# Patient Record
Sex: Female | Born: 1990
Health system: Southern US, Community
[De-identification: ages and names within clinical notes are randomized; demographics above are authoritative.]

## PROBLEM LIST (undated history)

## (undated) DIAGNOSIS — F410 Panic disorder [episodic paroxysmal anxiety] without agoraphobia: Secondary | ICD-10-CM

## (undated) DIAGNOSIS — M419 Scoliosis, unspecified: Secondary | ICD-10-CM

## (undated) DIAGNOSIS — F909 Attention-deficit hyperactivity disorder, unspecified type: Secondary | ICD-10-CM

---
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
## 2010-06-03 ENCOUNTER — Inpatient Hospital Stay (HOSPITAL_COMMUNITY): Admission: AD | Admit: 2010-06-03 | Discharge: 2010-06-03 | Payer: Self-pay | Admitting: Obstetrics and Gynecology

## 2010-06-03 ENCOUNTER — Ambulatory Visit: Payer: Self-pay | Admitting: Nurse Practitioner

## 2011-03-04 LAB — WET PREP, GENITAL
Trich, Wet Prep: NONE SEEN
Yeast Wet Prep HPF POC: NONE SEEN

## 2011-03-04 LAB — URINALYSIS, ROUTINE W REFLEX MICROSCOPIC
Bilirubin Urine: NEGATIVE
Ketones, ur: NEGATIVE mg/dL
Specific Gravity, Urine: 1.02 (ref 1.005–1.030)
Urobilinogen, UA: 0.2 mg/dL (ref 0.0–1.0)

## 2011-03-04 LAB — CBC
HCT: 42.9 % (ref 36.0–46.0)
Hemoglobin: 14.7 g/dL (ref 12.0–15.0)
MCHC: 34.2 g/dL (ref 30.0–36.0)
MCV: 90.7 fL (ref 78.0–100.0)
Platelets: 237 10*3/uL (ref 150–400)
RDW: 12.6 % (ref 11.5–15.5)

## 2011-03-04 LAB — GC/CHLAMYDIA PROBE AMP, GENITAL
Chlamydia, DNA Probe: NEGATIVE
GC Probe Amp, Genital: NEGATIVE

## 2011-03-04 LAB — POCT PREGNANCY, URINE: Preg Test, Ur: NEGATIVE

## 2011-03-18 ENCOUNTER — Inpatient Hospital Stay (HOSPITAL_COMMUNITY): Payer: Self-pay

## 2011-03-18 ENCOUNTER — Inpatient Hospital Stay (HOSPITAL_COMMUNITY)
Admission: AD | Admit: 2011-03-18 | Discharge: 2011-03-19 | Disposition: A | Discharge status: Home / Self Care | Payer: Self-pay | Source: Ambulatory Visit | Attending: Obstetrics & Gynecology | Admitting: Obstetrics & Gynecology

## 2011-03-18 DIAGNOSIS — R1031 Right lower quadrant pain: Secondary | ICD-10-CM

## 2011-03-18 LAB — CBC
Hemoglobin: 14.6 g/dL (ref 12.0–15.0)
MCH: 30.2 pg (ref 26.0–34.0)
MCHC: 34.3 g/dL (ref 30.0–36.0)
RBC: 4.83 MIL/uL (ref 3.87–5.11)
WBC: 10.7 10*3/uL — ABNORMAL HIGH (ref 4.0–10.5)

## 2011-03-18 LAB — URINALYSIS, ROUTINE W REFLEX MICROSCOPIC
Ketones, ur: >80 mg/dL — AB
Nitrite: NEGATIVE
Urobilinogen, UA: 0.2 mg/dL (ref 0.0–1.0)

## 2011-03-18 LAB — URINE MICROSCOPIC-ADD ON

## 2011-03-18 LAB — DIFFERENTIAL
Basophils Relative: 0 % (ref 0–1)
Eosinophils Relative: 0 % (ref 0–5)
Lymphocytes Relative: 19 % (ref 12–46)
Lymphs Abs: 2.1 10*3/uL (ref 0.7–4.0)
Monocytes Absolute: 0.7 10*3/uL (ref 0.1–1.0)
Monocytes Relative: 7 % (ref 3–12)

## 2011-03-18 LAB — COMPREHENSIVE METABOLIC PANEL
BUN: 15 mg/dL (ref 6–23)
Chloride: 104 mEq/L (ref 96–112)
Creatinine, Ser: 0.92 mg/dL (ref 0.4–1.2)
GFR calc Af Amer: >60 mL/min (ref >60)
Potassium: 4.4 mEq/L (ref 3.5–5.1)
Total Bilirubin: 1.3 mg/dL — ABNORMAL HIGH (ref 0.3–1.2)

## 2011-03-18 LAB — WET PREP, GENITAL: Trich, Wet Prep: NONE SEEN

## 2011-03-18 MED ORDER — IOHEXOL 300 MG/ML  SOLN
100.0000 mL | Freq: Once | INTRAMUSCULAR | Status: AC | PRN
  Administered 2011-03-18: 100 mL via INTRAVENOUS

## 2011-03-19 LAB — GC/CHLAMYDIA PROBE AMP, GENITAL: GC Probe Amp, Genital: NEGATIVE

## 2011-03-29 LAB — RH IMMUNE GLOB WKUP(>/=20WKS)(NOT WOMEN'S HOSP): Fetal Screen: NEGATIVE

## 2011-03-29 LAB — CBC
MCV: 84.9 fL (ref 78.0–98.0)
RBC: 4.12 MIL/uL (ref 3.80–5.70)
RDW: 13.4 % (ref 11.4–15.5)

## 2011-05-01 NOTE — Discharge Summary (Signed)
Cheryl Green, DOLS NO.:  [id number]   MEDICAL RECORD NO.:  [id number]          PATIENT TYPE:  INP   LOCATION:  9317                          FACILITY:  [facility]   PHYSICIAN:  Huel Cote, M.D. DATE OF BIRTH:  Nov 27, 1991   DATE OF ADMISSION:  01/04/2008  DATE OF DISCHARGE:  01/06/2008                               DISCHARGE SUMMARY   DISCHARGE DIAGNOSES:  1. Pre-term pregnancy at 35 weeks, delivered.  2. Status post precipitous vaginal delivery.  3. Initial plan was adoption for the baby, however, the patient has      decided to keep the baby and have him live primarily with the      father of the baby's parents.   DISCHARGE MEDICATIONS:  1. Motrin 600 mg p.o. every 6 hours.   DISCHARGE FOLLOWUP:  The patient is to follow up in 6 weeks for her  routine postpartum exam.   HOSPITAL COURSE:  The patient is a 20 year old G1, P0 who was admitted  at [redacted] weeks gestation, with an estimated due date of February 08, 2008  by late care and a 25-week ultrasound only, unknown LMP.  She presented  to labor and delivery of labor and delivery with complaints of  spontaneous rupture of membranes at 6 o'clock a.m. and on admission was  3 cm, 80% effaced, and zero station, at approximately 8:30 a.m. with  irregular contractions.  She was admitted, and I was called to her room  at 10:00 a.m. for delivery, after she arrived there was found to be  completely dilated and involuntarily pushing.  Prenatal care with very  scanty with two visits only to our office.  The patient had plans to  give the baby for adoption and a contacted the [employer] [facility] [employer]  but has since changed her mind regarding that.  She did have a positive  chlamydia culture in pregnancy for which she took antibiotics, however  never had her partner treated and had been intimate with him again, so  was re-treated her hospital stay.  Group B strep status was unknown, due  to her pre-term status.  Prenatal  labs are as follows O+,  __________  negative, RPR nonreactive, rubella immune, hepatitis B surface antigen  negative, HIV negative, GC negative, chlamydia positive with no test of  cure performed, group B strep unknown.  One-hour Glucola is 100.   PAST OBSTETRICAL HISTORY:  None.   PAST GYNECOLOGICAL HISTORY:  No abnormal Pap smears.   PAST SURGICAL HISTORY:  None.   PAST MEDICAL HISTORY:  None.   ALLERGIES:  NONE.   MEDICATIONS:  None.   She was afebrile with stable vital signs.  Fetal heart rate was  reactive.  Shortly after arrival labor delivery, she was found to be  completely dilated, completely effaced and +3 station with pushing.  She  pushed approximately three times with a normal spontaneous vaginal  delivery of a viable female infant over an intact perineum.  Apgars were 7  and 9, weight was 5 pounds, 4 ounces. There is a nuchal cord x1 which  was reduced over  the head.  NICU was in attendance, given the pre-term  status with poor dating and the placenta delivered spontaneously.  Cultures were done, and the placenta was sent to pathology.  Baby had a  good cry and was vigorous but did develop some grunting, so was taken to  the NICU for observation.  The patient was not able to receive group B  strep prophylaxis, secondary to her quick delivery.  NICU was notified  regarding the positive chlamydia and pregnancy.  Cervix and rectum were  intact.  Estimated blood loss was 350 mL.  On postpartum day #1, the  patient was doing well.  She had been to the __________  NICU and was  visiting, meeting with the social workers to determine any plan as far  as the adoption goes.  Her hemoglobin was 11.3.  She received another  gram of azithromycin p.o., given her lack of a test of cure and being  with her partner again who has not been tested.  He was also given a  prescription to fill upon discharge.  Postpartum day #2, the patient was  feeling quite well, except for some cramps.   Vaginal bleeding was  minimal.  After careful consideration, she and her partner have decided  to keep the baby.  The father of the baby's parents will be the primary  caregivers, and the baby stay with them, and the mother will visit.  Fundus is firm.  On discharge, she is afebrile, stable vital signs.  She  was given prescriptions for Motrin and was advised to follow up in the  office in 6 weeks.  She will be discharged to home.  The baby will be in  the NICU, probably for an additional week on antibiotics.      Huel Cote, M.D.  Electronically Signed     KR/MEDQ  D:  01/06/2008  T:  01/06/2008  Job:  [id number]

## 2011-05-01 NOTE — Discharge Summary (Signed)
Cheryl Green, PRIBBLE NO.:  [id number]   MEDICAL RECORD NO.:  [id number]          PATIENT TYPE:  INP   LOCATION:  9320                          [facility]ACILITY:  [facility]   PHYSICIAN:  Zenaida Niece, M.D.DATE OF BIRTH:  June 09, 1991   DATE OF ADMISSION:  02/19/2009  DATE OF DISCHARGE:  02/21/2009                               DISCHARGE SUMMARY   ADMISSION DIAGNOSIS:  Intrauterine pregnancy at 39 weeks.   DISCHARGE DIAGNOSIS:  Intrauterine pregnancy at 39 weeks.   PROCEDURES:  On February 19, 2009, she had a precipitous vaginal delivery.   COMPLICATIONS:  None.   HISTORY AND PHYSICAL:  This is a 20 year old gravida 2, para 0-1-0-1  with an EGA of [redacted] weeks who presents with complaint of rapid onset of  contractions without ruptured membranes or bleeding.  On evaluation in  triage, she was 8 cm dilated.   PRENATAL CARE:  She had an early ultrasound, but did not present for  prenatal care until approximately 30 weeks.  She was treated with  Zithromax for chlamydia twice and is putting the baby up for adoption.   PRENATAL LABORATORIES:  Blood type is O+ with a week D antibody.  RPR  nonreactive, hepatitis B surface antigen negative, rubella immune, HIV  negative, and gonorrhea negative, chlamydia positive, and group B strep  is negative.   PAST OBSTETRICAL HISTORY:  In January 2009, vaginal delivery at 35  weeks, 5 pounds 4 ounces complicated by preterm labor.   GYNECOLOGIC HISTORY:  History of chlamydia.   PAST MEDICAL HISTORY:  History of scoliosis.   PHYSICAL EXAMINATION:  She is afebrile with stable vital signs.  Fetal  heart tracing was okay prior to delivery.  On my first exam, she is  postpartum with firm fundus at U-1.  Placenta was delivered with fundal  pressure.  She had a first-degree laceration repaired with 3-0 Vicryl  with local block.   HOSPITAL COURSE:  The patient was admitted, rapidly progressed to  complete and had a precipitous delivery.  Cam Hai, nurse midwife  did the delivery as I was not yet at the hospital.  Delivery was  uncomplicated and she delivered a viable female with Apgars of 8 and 9,  weight 7 pounds and 3 ounces.  Postpartum, again I examined her and  delivered the placenta and repaired a small first-degree laceration.  Postpartum, she had no significant complications.  She is giving the  baby up for adoption.  Postpartum hemoglobin was 11.6.  On postpartum  #2, she was felt to be stable enough for discharge home.   DISCHARGE INSTRUCTIONS:  Regular diet, pelvic rest, and follow up in 6  weeks.   MEDICATIONS:  Percocet #20, one to two p.o. q.4-6 h. p.r.n. pain and  over-the-counter ibuprofen as needed and she was given our discharge  pamphlet.      Zenaida Niece, M.D.  Electronically Signed     TDM/MEDQ  D:  02/21/2009  T:  02/21/2009  Job:  [id number]

## 2011-07-06 ENCOUNTER — Emergency Department (HOSPITAL_COMMUNITY)
Admission: EM | Admit: 2011-07-06 | Discharge: 2011-07-06 | Disposition: A | Discharge status: Home / Self Care | Payer: Self-pay | Attending: Emergency Medicine | Admitting: Emergency Medicine

## 2011-07-06 DIAGNOSIS — R109 Unspecified abdominal pain: Secondary | ICD-10-CM | POA: Insufficient documentation

## 2011-07-06 LAB — DIFFERENTIAL
Eosinophils Absolute: 0.1 10*3/uL (ref 0.0–0.7)
Eosinophils Relative: 2 % (ref 0–5)
Lymphocytes Relative: 30 % (ref 12–46)
Lymphs Abs: 2 10*3/uL (ref 0.7–4.0)
Monocytes Absolute: 0.3 10*3/uL (ref 0.1–1.0)
Monocytes Relative: 4 % (ref 3–12)
Neutro Abs: 4.2 10*3/uL (ref 1.7–7.7)
Neutrophils Relative %: 64 % (ref 43–77)

## 2011-07-06 LAB — POCT I-STAT, CHEM 8
BUN: 13 mg/dL (ref 6–23)
Chloride: 106 mEq/L (ref 96–112)
Creatinine, Ser: 0.8 mg/dL (ref 0.50–1.10)
Hemoglobin: 13.6 g/dL (ref 12.0–15.0)
Sodium: 141 mEq/L (ref 135–145)
TCO2: 24 mmol/L (ref 0–100)

## 2011-07-06 LAB — URINALYSIS, ROUTINE W REFLEX MICROSCOPIC
Glucose, UA: NEGATIVE mg/dL
Leukocytes, UA: NEGATIVE
pH: 6.5 (ref 5.0–8.0)

## 2011-07-06 LAB — CBC
HCT: 37.6 % (ref 36.0–46.0)
MCV: 86.8 fL (ref 78.0–100.0)
RDW: 12.3 % (ref 11.5–15.5)

## 2011-07-06 LAB — WET PREP, GENITAL: Yeast Wet Prep HPF POC: NONE SEEN

## 2011-07-06 LAB — URINE MICROSCOPIC-ADD ON

## 2011-07-07 LAB — GC/CHLAMYDIA PROBE AMP, GENITAL: GC Probe Amp, Genital: NEGATIVE

## 2011-09-07 LAB — ANAEROBIC CULTURE

## 2011-09-07 LAB — CBC
MCHC: 33.8
MCV: 83.7
MCV: 84.3
Platelets: 235
Platelets: 303
RDW: 13.2
WBC: 13

## 2011-09-07 LAB — RPR: RPR Ser Ql: NONREACTIVE

## 2011-09-14 LAB — RH IMMUNE GLOBULIN WORKUP (NOT WOMEN'S HOSP)
ABO/RH(D): O NEG
Antibody Screen: NEGATIVE

## 2011-09-14 LAB — WET PREP, GENITAL: Trich, Wet Prep: NONE SEEN

## 2011-09-14 LAB — HCG, QUANTITATIVE, PREGNANCY: hCG, Beta Chain, Quant, S: 9792 — ABNORMAL HIGH

## 2011-09-14 LAB — CBC
Hemoglobin: 13.1
MCHC: 33.8
MCV: 88.4
RBC: 4.38
RDW: 13.5
WBC: 8.7

## 2011-09-14 LAB — ABO/RH: ABO/RH(D): O NEG

## 2011-10-26 ENCOUNTER — Emergency Department (HOSPITAL_COMMUNITY): Payer: Medicaid Other

## 2011-10-26 ENCOUNTER — Emergency Department (HOSPITAL_COMMUNITY)
Admission: EM | Admit: 2011-10-26 | Discharge: 2011-10-27 | Discharge status: Against Medical Advice | Payer: Medicaid Other | Attending: Emergency Medicine | Admitting: Emergency Medicine

## 2011-10-26 ENCOUNTER — Encounter: Payer: Self-pay | Admitting: *Deleted

## 2011-10-26 DIAGNOSIS — R5381 Other malaise: Secondary | ICD-10-CM | POA: Insufficient documentation

## 2011-10-26 DIAGNOSIS — H538 Other visual disturbances: Secondary | ICD-10-CM | POA: Insufficient documentation

## 2011-10-26 DIAGNOSIS — R51 Headache: Secondary | ICD-10-CM | POA: Insufficient documentation

## 2011-10-26 NOTE — ED Notes (Signed)
Patient walked out per the [employer] and did not talk to staff.  Will place back in waiting and attempt to call again

## 2011-10-26 NOTE — ED Notes (Signed)
Unable to locate patient. Called in waiting room, lobby, and triage. No answer

## 2011-10-26 NOTE — ED Notes (Signed)
Patient woke at 800 today with headache,  Blurred vision in left eye, feeling like she was going to faint.  Patient denies any weakness in her arms/legs.  She states she feels "the most light headed that she has ever felt in her life"

## 2012-01-06 ENCOUNTER — Encounter (HOSPITAL_COMMUNITY): Payer: Self-pay | Admitting: *Deleted

## 2012-01-06 ENCOUNTER — Emergency Department (HOSPITAL_COMMUNITY)
Admission: EM | Admit: 2012-01-06 | Discharge: 2012-01-06 | Disposition: A | Discharge status: Home / Self Care | Payer: Medicaid Other | Attending: Emergency Medicine | Admitting: Emergency Medicine

## 2012-01-06 ENCOUNTER — Other Ambulatory Visit: Payer: Self-pay

## 2012-01-06 DIAGNOSIS — F909 Attention-deficit hyperactivity disorder, unspecified type: Secondary | ICD-10-CM | POA: Insufficient documentation

## 2012-01-06 DIAGNOSIS — R0789 Other chest pain: Secondary | ICD-10-CM | POA: Insufficient documentation

## 2012-01-06 DIAGNOSIS — Z79899 Other long term (current) drug therapy: Secondary | ICD-10-CM | POA: Insufficient documentation

## 2012-01-06 DIAGNOSIS — R000 Tachycardia, unspecified: Secondary | ICD-10-CM | POA: Insufficient documentation

## 2012-01-06 DIAGNOSIS — R0989 Other specified symptoms and signs involving the circulatory and respiratory systems: Secondary | ICD-10-CM | POA: Insufficient documentation

## 2012-01-06 DIAGNOSIS — F410 Panic disorder [episodic paroxysmal anxiety] without agoraphobia: Secondary | ICD-10-CM | POA: Insufficient documentation

## 2012-01-06 DIAGNOSIS — F411 Generalized anxiety disorder: Secondary | ICD-10-CM | POA: Insufficient documentation

## 2012-01-06 DIAGNOSIS — R002 Palpitations: Secondary | ICD-10-CM | POA: Insufficient documentation

## 2012-01-06 DIAGNOSIS — R112 Nausea with vomiting, unspecified: Secondary | ICD-10-CM | POA: Insufficient documentation

## 2012-01-06 DIAGNOSIS — R109 Unspecified abdominal pain: Secondary | ICD-10-CM | POA: Insufficient documentation

## 2012-01-06 DIAGNOSIS — R0609 Other forms of dyspnea: Secondary | ICD-10-CM | POA: Insufficient documentation

## 2012-01-06 HISTORY — DX: Attention-deficit hyperactivity disorder, unspecified type: F90.9

## 2012-01-06 LAB — DIFFERENTIAL
Basophils Absolute: 0 10*3/uL (ref 0.0–0.1)
Basophils Relative: 0 % (ref 0–1)
Lymphocytes Relative: 9 % — ABNORMAL LOW (ref 12–46)
Monocytes Absolute: 0.9 10*3/uL (ref 0.1–1.0)
Neutro Abs: 11.4 10*3/uL — ABNORMAL HIGH (ref 1.7–7.7)
Neutrophils Relative %: 84 % — ABNORMAL HIGH (ref 43–77)

## 2012-01-06 LAB — RAPID URINE DRUG SCREEN, HOSP PERFORMED
Barbiturates: NOT DETECTED
Benzodiazepines: NOT DETECTED
Cocaine: NOT DETECTED
Tetrahydrocannabinol: POSITIVE — AB

## 2012-01-06 LAB — POCT PREGNANCY, URINE: Preg Test, Ur: NEGATIVE

## 2012-01-06 LAB — POCT I-STAT, CHEM 8
BUN: 10 mg/dL (ref 6–23)
Calcium, Ion: 1.15 mmol/L (ref 1.12–1.32)
Chloride: 106 mEq/L (ref 96–112)
HCT: 35 % — ABNORMAL LOW (ref 36.0–46.0)
Potassium: 3.6 mEq/L (ref 3.5–5.1)
Sodium: 144 mEq/L (ref 135–145)

## 2012-01-06 LAB — CBC
HCT: 34.6 % — ABNORMAL LOW (ref 36.0–46.0)
MCHC: 34.7 g/dL (ref 30.0–36.0)
Platelets: 170 10*3/uL (ref 150–400)
RDW: 12.2 % (ref 11.5–15.5)
WBC: 13.5 10*3/uL — ABNORMAL HIGH (ref 4.0–10.5)

## 2012-01-06 MED ORDER — LORAZEPAM 1 MG PO TABS: 1.0000 mg | ORAL_TABLET | Freq: Three times a day (TID) | ORAL | Status: AC | PRN

## 2012-01-06 MED ORDER — LORAZEPAM 2 MG/ML IJ SOLN
INTRAMUSCULAR | Status: AC
  Filled 2012-01-06: qty 1

## 2012-01-06 MED ORDER — ONDANSETRON 8 MG PO TBDP: 8.0000 mg | ORAL_TABLET | Freq: Three times a day (TID) | ORAL | Status: AC | PRN

## 2012-01-06 MED ORDER — LORAZEPAM 2 MG/ML IJ SOLN
1.0000 mg | Freq: Once | INTRAMUSCULAR | Status: AC
  Administered 2012-01-06: 1 mg via INTRAVENOUS

## 2012-01-06 MED ORDER — ONDANSETRON HCL 4 MG/2ML IJ SOLN
INTRAMUSCULAR | Status: AC
  Administered 2012-01-06: 03:00:00
  Filled 2012-01-06: qty 2

## 2012-01-06 MED ORDER — PROMETHAZINE HCL 25 MG/ML IJ SOLN
12.5000 mg | INTRAMUSCULAR | Status: AC
  Administered 2012-01-06: 12.5 mg via INTRAVENOUS
  Filled 2012-01-06: qty 1

## 2012-01-06 MED ORDER — LORAZEPAM 2 MG/ML IJ SOLN
INTRAMUSCULAR | Status: AC
  Administered 2012-01-06: 2 mg via INTRAVENOUS
  Filled 2012-01-06: qty 1

## 2012-01-06 NOTE — ED Notes (Signed)
Second liter of NS completed.  Patient resting quietly.

## 2012-01-06 NOTE — ED Notes (Signed)
Patient states that the door looks like it is moving in and out.

## 2012-01-06 NOTE — ED Notes (Addendum)
Pt up to the bathroom without assistance. States 7/10 pain all over body. She is alert and oriented x4. Vital signs stable at the present. No signs of distress noted. Boyfriend at bedside.

## 2012-01-06 NOTE — ED Notes (Signed)
Pt discharged home. Had no further questions. Vital signs stable. 

## 2012-01-06 NOTE — ED Provider Notes (Signed)
History     CSN: [id number]  Arrival date & time 01/06/12  2130   First MD Initiated Contact with Patient 01/06/12 [phone]      Chief Complaint  Patient presents with  . Palpitations, dyspnea     (Consider location/radiation/quality/duration/timing/severity/associated sxs/prior treatment) HPI This is a 21 year old white female with a history of panic attacks. She had the sudden onset of nausea vomiting about 30 minutes ago. There was no diarrhea associated with this but there was some abdominal cramping. About 20 minutes after this began she felt palpitations, by which she means a rapid heart rate, chest tightness, anxiety and a sense of impending doom. She states she had a similar panic attack about 3 weeks ago. She describes her symptoms as moderate to severe. She states she took and laterally yesterday for the first time" quite a while".  Past Medical History  Diagnosis Date  . ADHD (attention deficit hyperactivity disorder)     History reviewed. No pertinent past surgical history.  History reviewed. No pertinent family history.  History  Substance Use Topics  . Smoking status: Passive Smoker  . Smokeless tobacco: Not on file  . Alcohol Use: Yes     ocassionally    OB History    Grav Para Term Preterm Abortions TAB SAB Ect Mult Living                  Review of Systems  All other systems reviewed and are negative.    Allergies  Review of patient's allergies indicates no known allergies.  Home Medications   Current Outpatient Rx  Name Route Sig Dispense Refill  . AMPHETAMINE-DEXTROAMPHET ER 30 MG PO CP24 Oral Take 30 mg by mouth every morning.      BP 113/53  Pulse 90  Temp(Src) 98.7 F (37.1 C) (Oral)  Resp 18  Ht 5\' 8"  (1.727 m)  Wt 110 lb (49.896 kg)  BMI 16.73 kg/m2  SpO2 99%  LMP 12/16/2011  Physical Exam General: Well-developed, well-nourished female; appearance consistent with age of record HENT: normocephalic, atraumatic; no pharyngeal  edema; no stridor; no dysphonia Eyes: pupils equal round and reactive to light; extraocular muscles intact Neck: supple Heart: regular rate and rhythm; tachycardic Lungs: clear to auscultation bilaterally Abdomen: soft; nondistended; nontender; bowel sounds present Extremities: No deformity; full range of motion; pulses normal Neurologic: Awake, alert and oriented; motor function intact in all extremities and symmetric; no facial droop Skin: Warm and dry Psychiatric: Tearful; anxious    ED Course  Procedures (including critical care time)     MDM  EKG Interpretation:  Date & Time: 01/06/2012 3:10 AM  Rate: 134  Rhythm: sinus tachycardia  QRS Axis: normal  Intervals: normal  ST/T Wave abnormalities: normal  Conduction Disutrbances:none  Narrative Interpretation: jitter artifact limits interpretation.  Old EKG Reviewed: none available  5:16 AM Patient still symptomatic despite Ativan 2 mg and Zofran 1 mg. Patient is retching with phonation but no frank emesis. Tachycardia persists.  6:24 AM Patient sleeping after 12.5 mg of Phenergan IV. Her boyfriend states that she was very confused after being given Phenergan as well as agitated. That has improved. She is noted to be in and out of tachycardia. Her last systolic blood pressure is 87. IV fluids are being infused lab studies ordered.  Nursing notes and vitals signs, including pulse oximetry, reviewed.  Summary of this visit's results, reviewed by myself:  Labs:  Results for orders placed during the hospital encounter of 01/06/12  CBC  Component Value Range   WBC 13.5 (*) 4.0 - 10.5 (K/uL)   RBC 4.00  3.87 - 5.11 (MIL/uL)   Hemoglobin 12.0  12.0 - 15.0 (g/dL)   HCT 16.1 (*) 09.6 - 46.0 (%)   MCV 86.5  78.0 - 100.0 (fL)   MCH 30.0  26.0 - 34.0 (pg)   MCHC 34.7  30.0 - 36.0 (g/dL)   RDW 04.5  40.9 - 81.1 (%)   Platelets 170  150 - 400 (K/uL)  DIFFERENTIAL      Component Value Range   Neutrophils Relative 84 (*)  43 - 77 (%)   Neutro Abs 11.4 (*) 1.7 - 7.7 (K/uL)   Lymphocytes Relative 9 (*) 12 - 46 (%)   Lymphs Abs 1.2  0.7 - 4.0 (K/uL)   Monocytes Relative 7  3 - 12 (%)   Monocytes Absolute 0.9  0.1 - 1.0 (K/uL)   Eosinophils Relative 0  0 - 5 (%)   Eosinophils Absolute 0.0  0.0 - 0.7 (K/uL)   Basophils Relative 0  0 - 1 (%)   Basophils Absolute 0.0  0.0 - 0.1 (K/uL)  POCT I-STAT, CHEM 8      Component Value Range   Sodium 144  135 - 145 (mEq/L)   Potassium 3.6  3.5 - 5.1 (mEq/L)   Chloride 106  96 - 112 (mEq/L)   BUN 10  6 - 23 (mg/dL)   Creatinine, Ser 9.14  0.50 - 1.10 (mg/dL)   Glucose, Bld 782 (*) 70 - 99 (mg/dL)   Calcium, Ion 9.56  2.13 - 1.32 (mmol/L)   TCO2 25  0 - 100 (mmol/L)   Hemoglobin 11.9 (*) 12.0 - 15.0 (g/dL)   HCT 08.6 (*) 57.8 - 46.0 (%)  POCT PREGNANCY, URINE      Component Value Range   Preg Test, Ur NEGATIVE     8:14 AM Patient is feeling much better. Anxiety is resolved. Tachycardia is improved. Nausea has improved. Patient was advised to discontinue Adderall pending follow up with her physician. She was advised that she likely has or had a GI virus the symptoms of which triggered her panic attack. The Adderall may have contributed to this.           Hanley Seamen, MD 01/06/12 [phone]

## 2012-01-06 NOTE — ED Notes (Signed)
Patient presents stating about 30 mins prior to arrival she began to have nausea and felt like her heart was beating fast.

## 2012-01-07 NOTE — ED Notes (Signed)
Pt sts the pharmacy [facility] will not take her Medicaid for Zofran ODT, so Remi Haggard gave a new Rx for Phenergan 25mg  PO q 6hr PRN Nausea, Disp #12, Patient called and told to pick up new rx.

## 2012-05-08 ENCOUNTER — Encounter (HOSPITAL_COMMUNITY): Payer: Self-pay | Admitting: *Deleted

## 2012-05-08 ENCOUNTER — Emergency Department (HOSPITAL_COMMUNITY)
Admission: EM | Admit: 2012-05-08 | Discharge: 2012-05-08 | Disposition: A | Discharge status: Home / Self Care | Payer: Self-pay | Attending: Emergency Medicine | Admitting: Emergency Medicine

## 2012-05-08 DIAGNOSIS — F419 Anxiety disorder, unspecified: Secondary | ICD-10-CM

## 2012-05-08 DIAGNOSIS — F411 Generalized anxiety disorder: Secondary | ICD-10-CM | POA: Insufficient documentation

## 2012-05-08 DIAGNOSIS — N898 Other specified noninflammatory disorders of vagina: Secondary | ICD-10-CM | POA: Insufficient documentation

## 2012-05-08 DIAGNOSIS — R1110 Vomiting, unspecified: Secondary | ICD-10-CM | POA: Insufficient documentation

## 2012-05-08 DIAGNOSIS — R1031 Right lower quadrant pain: Secondary | ICD-10-CM | POA: Insufficient documentation

## 2012-05-08 HISTORY — DX: Panic disorder (episodic paroxysmal anxiety): F41.0

## 2012-05-08 LAB — WET PREP, GENITAL
Clue Cells Wet Prep HPF POC: NONE SEEN
Trich, Wet Prep: NONE SEEN
Yeast Wet Prep HPF POC: NONE SEEN

## 2012-05-08 LAB — URINALYSIS, ROUTINE W REFLEX MICROSCOPIC
Glucose, UA: NEGATIVE mg/dL
Hgb urine dipstick: NEGATIVE
Ketones, ur: >80 mg/dL — AB
Nitrite: NEGATIVE
Protein, ur: NEGATIVE mg/dL
Specific Gravity, Urine: 1.031 — ABNORMAL HIGH (ref 1.005–1.030)
Urobilinogen, UA: 0.2 mg/dL (ref 0.0–1.0)
pH: 6 (ref 5.0–8.0)

## 2012-05-08 LAB — POCT PREGNANCY, URINE: Preg Test, Ur: NEGATIVE

## 2012-05-08 LAB — URINE MICROSCOPIC-ADD ON

## 2012-05-08 LAB — POCT I-STAT, CHEM 8
Calcium, Ion: 1.23 mmol/L (ref 1.12–1.32)
Glucose, Bld: 90 mg/dL (ref 70–99)
HCT: 42 % (ref 36.0–46.0)
Hemoglobin: 14.3 g/dL (ref 12.0–15.0)
TCO2: 25 mmol/L (ref 0–100)

## 2012-05-08 MED ORDER — LORAZEPAM 2 MG/ML IJ SOLN
INTRAMUSCULAR | Status: AC
  Administered 2012-05-08: 0.5 mg via INTRAVENOUS
  Filled 2012-05-08: qty 1

## 2012-05-08 MED ORDER — ONDANSETRON HCL 4 MG/2ML IJ SOLN
4.0000 mg | Freq: Once | INTRAMUSCULAR | Status: AC
  Administered 2012-05-08: 4 mg via INTRAVENOUS
  Filled 2012-05-08: qty 2

## 2012-05-08 MED ORDER — LORAZEPAM 1 MG PO TABS
0.5000 mg | ORAL_TABLET | Freq: Once | ORAL | Status: AC
  Administered 2012-05-08: 0.5 mg via ORAL
  Filled 2012-05-08: qty 1

## 2012-05-08 MED ORDER — LORAZEPAM 2 MG/ML IJ SOLN
0.5000 mg | Freq: Once | INTRAMUSCULAR | Status: AC
  Administered 2012-05-08: 0.5 mg via INTRAVENOUS

## 2012-05-08 MED ORDER — SODIUM CHLORIDE 0.9 % IV BOLUS (SEPSIS)
1000.0000 mL | INTRAVENOUS | Status: AC
  Administered 2012-05-08: 1000 mL via INTRAVENOUS

## 2012-05-08 MED ORDER — PROMETHAZINE HCL 25 MG PO TABS: 12.5000 mg | ORAL_TABLET | Freq: Four times a day (QID) | ORAL | Status: DC | PRN

## 2012-05-08 MED ORDER — LORAZEPAM 1 MG PO TABS: 0.5000 mg | ORAL_TABLET | Freq: Three times a day (TID) | ORAL | Status: AC | PRN

## 2012-05-08 NOTE — ED Notes (Signed)
Sitting upright on stretcher with significant other at bedside; states she wishes to go home at this point as she is not feeling any better than at time of arrival; pt appears more relaxed after Ativan; however, interm tearful

## 2012-05-08 NOTE — ED Notes (Signed)
PT is crying and states she has been vomiting a lot.  Pt not sure if it is a panic attack and sts this happens a lot to the point she cannot even move her body

## 2012-05-08 NOTE — Discharge Instructions (Signed)
Anxiety and Panic Attacks Your caregiver has informed you that you are having an anxiety or panic attack. There may be many forms of this. Most of the time these attacks come suddenly and without warning. They come at any time of day, including periods of sleep, and at any time of life. They may be strong and unexplained. Although panic attacks are very scary, they are physically harmless. Sometimes the cause of your anxiety is not known. Anxiety is a protective mechanism of the body in its fight or flight mechanism. Most of these perceived danger situations are actually nonphysical situations (such as anxiety over losing a job). CAUSES  The causes of an anxiety or panic attack are many. Panic attacks may occur in otherwise healthy people given a certain set of circumstances. There may be a genetic cause for panic attacks. Some medications may also have anxiety as a side effect. SYMPTOMS  Some of the most common feelings are:  Intense terror.   Dizziness, feeling faint.   Hot and cold flashes.   Fear of going crazy.   Feelings that nothing is real.   Sweating.   Shaking.   Chest pain or a fast heartbeat (palpitations).   Smothering, choking sensations.   Feelings of impending doom and that death is near.   Tingling of extremities, this may be from over-breathing.   Altered reality (derealization).   Being detached from yourself (depersonalization).  Several symptoms can be present to make up anxiety or panic attacks. DIAGNOSIS  The evaluation by your caregiver will depend on the type of symptoms you are experiencing. The diagnosis of anxiety or panic attack is made when no physical illness can be determined to be a cause of the symptoms. TREATMENT  Treatment to prevent anxiety and panic attacks may include:  Avoidance of circumstances that cause anxiety.   Reassurance and relaxation.   Regular exercise.   Relaxation therapies, such as yoga.   Psychotherapy with a  psychiatrist or therapist.   Avoidance of caffeine, alcohol and illegal drugs.   Prescribed medication.  SEEK IMMEDIATE MEDICAL CARE IF:   You experience panic attack symptoms that are different than your usual symptoms.   You have any worsening or concerning symptoms.  Document Released: 12/03/2005 Document Revised: 11/22/2011 Document Reviewed: 04/06/2010 Exit[employer] Patient Information 2012 [employer]   RESOURCE GUIDE  Dental Problems  Patients with Medicaid: [facility]                     [facility] [address]                                           [address] Phone:  [phone]                                                  Phone:  [phone]  If unable to pay or uninsured, contact:  [facility] or [facility] to become qualified for the adult dental clinic.  Chronic Pain Problems Contact [facility]  [phone] Patients need to be referred by their primary care doctor.  Insufficient Money for Medicine Contact [employer]:  call "211" or [facility] 271-5999.    No Primary Care Doctor Call [facility]  832-8000 Other agencies that provide inexpensive medical care    [facility]  832-8035    [facility]  832-7272    [facility]  [phone]    [facility] Clinic  832-4777    [facility]  [phone]    [facility]  [phone]  Psychological Services [facility]  [phone] [facility]  [phone] [facility]   800 853-5163 (emergency services 641-4993)  Substance Abuse Resources [facility]  336-882-2125 [facility] 336-784-9470 [facility] 336-285-9073 [facility] 336-845-3988 Residential & Outpatient Substance Abuse Program  800-659-3381  Abuse/Neglect [place] Child Abuse Hotline (336) 641-3795 [place] Child Abuse Hotline 800-378-5315  (After Hours)  Emergency Shelter [facility] (336) 271-5985  [facility] (336) 275-9566 [facility] (704) 372-4663  MRSA Hotline #:   832-7006    [place] [facility]  [facility] [place]     [employer]                          [facility] [address] [place]                       [address]      [address]                                                  [place]                            [place] Phone:  [phone]                                   Phone:  [phone]                 Phone:  [phone]  [facility] Phone:  [phone]  [place] Child Abuse Hotline (336) 342-1394 (336) 342-3537 (After Hours)   

## 2012-05-08 NOTE — ED Notes (Signed)
Up to restroom to obtain urine specimen  

## 2012-05-08 NOTE — ED Provider Notes (Signed)
History     CSN: [id number]  Arrival date & time 05/08/12  1420   None     Chief Complaint  Patient presents with  . Emesis    (Consider location/radiation/quality/duration/timing/severity/associated sxs/prior treatment) Patient is a 21 y.o. female presenting with anxiety. The history is provided by the patient.  Anxiety This is a chronic problem. The current episode started more than 1 year ago. The problem occurs daily. The problem has been gradually worsening. Associated symptoms include abdominal pain (occasional). Pertinent negatives include no chest pain, congestion, coughing, fatigue, fever, headaches, nausea, neck pain or vomiting. Associated symptoms comments: vomiting. The symptoms are aggravated by nothing. Treatments tried: ativan. The treatment provided moderate relief.    Past Medical History  Diagnosis Date  . ADHD (attention deficit hyperactivity disorder)   . Anxiety attack     History reviewed. No pertinent past surgical history.  No family history on file.  History  Substance Use Topics  . Smoking status: Passive Smoker  . Smokeless tobacco: Not on file  . Alcohol Use: Yes     ocassionally    OB History    Grav Para Term Preterm Abortions TAB SAB Ect Mult Living                  Review of Systems  Constitutional: Negative for fever and fatigue.  HENT: Negative for congestion, drooling and neck pain.   Eyes: Negative for pain.  Respiratory: Negative for cough and shortness of breath.   Cardiovascular: Negative for chest pain.  Gastrointestinal: Positive for abdominal pain (occasional). Negative for nausea, vomiting and diarrhea.  Genitourinary: Positive for vaginal discharge (mild white d/c). Negative for dysuria and hematuria.  Musculoskeletal: Negative for back pain and gait problem.  Skin: Negative for color change.  Neurological: Negative for dizziness and headaches.  Hematological: Negative for adenopathy.  Psychiatric/Behavioral:  Negative for behavioral problems.  All other systems reviewed and are negative.    Allergies  Review of patient's allergies indicates no known allergies.  Home Medications   Current Outpatient Rx  Name Route Sig Dispense Refill  . CYANOCOBALAMIN 500 MCG PO TABS Oral Take 500 mcg by mouth daily.    Marland Kitchen LORAZEPAM 0.5 MG PO TABS Oral Take 0.5 mg by mouth every 4 (four) hours.    Marland Kitchen LORAZEPAM 1 MG PO TABS Oral Take 0.5 tablets (0.5 mg total) by mouth every 8 (eight) hours as needed for anxiety. 15 tablet 0  . PROMETHAZINE HCL 25 MG PO TABS Oral Take 0.5 tablets (12.5 mg total) by mouth every 6 (six) hours as needed for nausea. 15 tablet 0    BP 89/56  Pulse 71  Temp 97.9 F (36.6 C)  Resp 16  SpO2 97%  LMP 04/24/2012  Physical Exam  Constitutional: She is oriented to person, place, and time. She appears well-developed and well-nourished.  HENT:  Head: Normocephalic.  Mouth/Throat: No oropharyngeal exudate.  Eyes: Conjunctivae and EOM are normal. Pupils are equal, round, and reactive to light.  Neck: Normal range of motion. Neck supple.  Cardiovascular: Normal rate, regular rhythm, normal heart sounds and intact distal pulses.  Exam reveals no gallop and no friction rub.   No murmur heard. Pulmonary/Chest: Effort normal and breath sounds normal. No respiratory distress. She has no wheezes.  Abdominal: Soft. Bowel sounds are normal. There is no tenderness.  Genitourinary: Vagina normal and uterus normal. Cervix exhibits no motion tenderness and no discharge. Right adnexum displays no tenderness. Left adnexum displays no tenderness.  No vaginal discharge found.       Normal appearing external vaginal. Pink, glistening walls of the vaginal mucosa. Normal appearing cervix, os closed. Mild amount of white d/c in posterior fornix.   Musculoskeletal: Normal range of motion. She exhibits no edema and no tenderness.  Neurological: She is alert and oriented to person, place, and time.  Skin:  Skin is warm and dry.  Psychiatric: Her speech is normal and behavior is normal. Judgment and thought content normal. Her mood appears anxious. Cognition and memory are normal.       Pt is anxious and tearful on exam. Denies SI's.     ED Course  Procedures (including critical care time)  Labs Reviewed  URINALYSIS, ROUTINE W REFLEX MICROSCOPIC - Abnormal; Notable for the following:    Specific Gravity, Urine 1.031 (*)    Bilirubin Urine SMALL (*)    Ketones, ur >80 (*)    Leukocytes, UA SMALL (*)    All other components within normal limits  WET PREP, GENITAL - Abnormal; Notable for the following:    WBC, Wet Prep HPF POC MANY (*)    All other components within normal limits  URINE MICROSCOPIC-ADD ON - Abnormal; Notable for the following:    Squamous Epithelial / LPF FEW (*)    Bacteria, UA FEW (*)    All other components within normal limits  POCT I-STAT, CHEM 8  POCT PREGNANCY, URINE  GC/CHLAMYDIA PROBE AMP, GENITAL   No results found.   1. Anxiety   2. Vomiting       MDM  9:05 PM 21 y.o. female w hx of anxiety attacks pw increased anxiety and vomiting cw previous episodes in the past. Pt notes she had been on ativan po for several mos, has been off for 2 weeks bc she can no longer afford the meds. Pt AFVSS here, appears well on exam. She notes she has had mild white vag d/c x 2 weeks, occasional RLQ pain. Will perform pelvic and get upreg/ua. Will give ativan for anxiety.    9:05 PM: Pt continues to appear well on exam. Gave 1L IVF d/t likely mild dehydration as pt had ketones in her urine. Other labs non-contrib, neg upreg.  I have discussed the diagnosis/risks/treatment options with the patient and believe the pt to be eligible for discharge home to call to establish with a pcp or psychiatrist for follow up, numbers given. We also discussed returning to the ED immediately if new or worsening sx occur. We discussed the sx which are most concerning (e.g., worsening anxiety,  vomiting, or abd pain) that necessitate immediate return. Any new prescriptions provided to the patient are listed below.  New Prescriptions   LORAZEPAM (ATIVAN) 1 MG TABLET    Take 0.5 tablets (0.5 mg total) by mouth every 8 (eight) hours as needed for anxiety.   PROMETHAZINE (PHENERGAN) 25 MG TABLET    Take 0.5 tablets (12.5 mg total) by mouth every 6 (six) hours as needed for nausea.    Clinical Impression 1. Anxiety   2. Vomiting         Purvis Sheffield, MD 05/08/12 2107

## 2012-05-08 NOTE — ED Notes (Signed)
Resting quietly on stretcher with significant other at bedside; states feeling "some better"

## 2012-05-08 NOTE — ED Provider Notes (Signed)
   I saw and evaluated the patient, reviewed the resident's note and I agree with the findings and plan.  Pt is not toxic, no further episodes of N/V.  VS improved.  Meds given for anxiety.  Appropriate for follow up with PCP as outpt.  HCG neg, labs neg, no rebound on exam, gait normal, steady.    Gavin Pound. Oletta Lamas, MD 05/08/12 2153

## 2012-05-10 LAB — GC/CHLAMYDIA PROBE AMP, GENITAL
Chlamydia, DNA Probe: NEGATIVE
GC Probe Amp, Genital: NEGATIVE

## 2012-10-28 ENCOUNTER — Encounter (HOSPITAL_COMMUNITY): Payer: Self-pay | Admitting: *Deleted

## 2012-10-28 ENCOUNTER — Emergency Department (HOSPITAL_COMMUNITY)
Admission: EM | Admit: 2012-10-28 | Discharge: 2012-10-28 | Disposition: A | Discharge status: Home / Self Care | Payer: Self-pay | Attending: Emergency Medicine | Admitting: Emergency Medicine

## 2012-10-28 DIAGNOSIS — K219 Gastro-esophageal reflux disease without esophagitis: Secondary | ICD-10-CM | POA: Insufficient documentation

## 2012-10-28 DIAGNOSIS — F410 Panic disorder [episodic paroxysmal anxiety] without agoraphobia: Secondary | ICD-10-CM

## 2012-10-28 DIAGNOSIS — F17200 Nicotine dependence, unspecified, uncomplicated: Secondary | ICD-10-CM | POA: Insufficient documentation

## 2012-10-28 DIAGNOSIS — F411 Generalized anxiety disorder: Secondary | ICD-10-CM | POA: Insufficient documentation

## 2012-10-28 DIAGNOSIS — Z79899 Other long term (current) drug therapy: Secondary | ICD-10-CM | POA: Insufficient documentation

## 2012-10-28 MED ORDER — LORAZEPAM 1 MG PO TABS: 1.0000 mg | ORAL_TABLET | Freq: Three times a day (TID) | ORAL | Status: DC | PRN

## 2012-10-28 MED ORDER — LORAZEPAM 1 MG PO TABS
1.0000 mg | ORAL_TABLET | Freq: Once | ORAL | Status: AC
  Administered 2012-10-28: 1 mg via ORAL
  Filled 2012-10-28: qty 1

## 2012-10-28 NOTE — ED Provider Notes (Signed)
Medical screening examination/treatment/procedure(s) were performed by non-physician practitioner and as supervising physician I was immediately available for consultation/collaboration.   Ervan Heber L Devanee Pomplun, MD 10/28/12 0716 

## 2012-10-28 NOTE — ED Notes (Signed)
Discharge instructions reviewed w/ pt., verbalizes understanding. One prescription provided at discharge. 

## 2012-10-28 NOTE — ED Notes (Signed)
Pt c/o panic attacks since age 21; worse in last few months

## 2012-10-28 NOTE — ED Provider Notes (Signed)
History     CSN: [id number]  Arrival date & time 10/28/12  0150   First MD Initiated Contact with Patient 10/28/12 0209      Chief Complaint  Patient presents with  . Panic Attack   HPI  History provided by the patient and significant other. Patient is a 21 year old female with previous history of ADHD and anxiety who presents with concerns for panic attack. Patient reports sitting and watching TV and eating applesauce when she suddenly felt overwhelmed typical of her panic attacks and anxiety. Patient states that she rests outside because it could air Britta Mccreedy. She began to hyperventilate and her partner try to call her and slow her respirations. She reports feeling lightheaded and laid down on the ground. She denies any loss of consciousness. Symptoms seem to last much longer than previous panic attacks and patient felt to area nervous for approximately 30 minutes. Currently patient states symptoms have improved some. Patient reports having worsening similar episodes for the past one to 2 months.    Past Medical History  Diagnosis Date  . ADHD (attention deficit hyperactivity disorder)   . Anxiety attack     History reviewed. No pertinent past surgical history.  No family history on file.  History  Substance Use Topics  . Smoking status: Current Some Day Smoker  . Smokeless tobacco: Not on file  . Alcohol Use: Yes     Comment: ocassionally    OB History    Grav Para Term Preterm Abortions TAB SAB Ect Mult Living                  Review of Systems  Constitutional: Positive for chills. Negative for fever, diaphoresis and appetite change.  Respiratory: Positive for shortness of breath. Negative for cough.   Cardiovascular: Positive for palpitations. Negative for chest pain.  Gastrointestinal: Positive for nausea. Negative for vomiting, diarrhea and constipation.  Genitourinary: Positive for menstrual problem. Negative for dysuria, frequency, hematuria and flank pain.   Skin: Negative for rash.  Neurological: Positive for numbness. Negative for seizures, syncope, speech difficulty and weakness.  All other systems reviewed and are negative.    Allergies  Review of patient's allergies indicates no known allergies.  Home Medications   Current Outpatient Rx  Name  Route  Sig  Dispense  Refill  . CYANOCOBALAMIN 500 MCG PO TABS   Oral   Take 500 mcg by mouth daily.         Marland Kitchen LORAZEPAM 0.5 MG PO TABS   Oral   Take 0.5 mg by mouth every 4 (four) hours.         Marland Kitchen PROMETHAZINE HCL 25 MG PO TABS   Oral   Take 0.5 tablets (12.5 mg total) by mouth every 6 (six) hours as needed for nausea.   15 tablet   0     BP 134/89  Pulse 113  Temp 98.5 F (36.9 C) (Oral)  Resp 24  SpO2 100%  LMP 10/14/2012  Physical Exam  Nursing note and vitals reviewed. Constitutional: She is oriented to person, place, and time. She appears well-developed and well-nourished. No distress.  HENT:  Head: Normocephalic.  Cardiovascular: Normal rate and regular rhythm.   No murmur heard. Pulmonary/Chest: Effort normal and breath sounds normal. No respiratory distress. She has no wheezes. She has no rales.  Abdominal: Soft. Bowel sounds are normal. She exhibits no distension and no mass. There is no tenderness. There is no rebound and no guarding.  Musculoskeletal: Normal  range of motion. She exhibits no edema and no tenderness.  Neurological: She is alert and oriented to person, place, and time.  Skin: Skin is warm and dry. No rash noted.  Psychiatric: She has a normal mood and affect. Her behavior is normal.    ED Course  Procedures       1. Anxiety attack       MDM  2:20AM patient seen and evaluated. Patient in no acute distress and appears calm at this time. She still reports having waves of anxiety and does develop rushed speech.   Patient has been seen previously for similar symptoms and reports receiving a prescription of Ativan. She does report  that this seemed to help her symptoms but that should she stop taking this her symptoms seemed to worsen and become more frequent.      Angus Seller, [place] 10/28/12 [phone]

## 2013-06-21 ENCOUNTER — Emergency Department (HOSPITAL_COMMUNITY)
Admission: EM | Admit: 2013-06-21 | Discharge: 2013-06-21 | Disposition: A | Discharge status: Home / Self Care | Payer: Self-pay | Attending: Emergency Medicine | Admitting: Emergency Medicine

## 2013-06-21 ENCOUNTER — Encounter (HOSPITAL_COMMUNITY): Payer: Self-pay | Admitting: Emergency Medicine

## 2013-06-21 DIAGNOSIS — F909 Attention-deficit hyperactivity disorder, unspecified type: Secondary | ICD-10-CM | POA: Insufficient documentation

## 2013-06-21 DIAGNOSIS — J3489 Other specified disorders of nose and nasal sinuses: Secondary | ICD-10-CM | POA: Insufficient documentation

## 2013-06-21 DIAGNOSIS — F17200 Nicotine dependence, unspecified, uncomplicated: Secondary | ICD-10-CM | POA: Insufficient documentation

## 2013-06-21 DIAGNOSIS — F410 Panic disorder [episodic paroxysmal anxiety] without agoraphobia: Secondary | ICD-10-CM | POA: Insufficient documentation

## 2013-06-21 DIAGNOSIS — Z79899 Other long term (current) drug therapy: Secondary | ICD-10-CM | POA: Insufficient documentation

## 2013-06-21 DIAGNOSIS — L259 Unspecified contact dermatitis, unspecified cause: Secondary | ICD-10-CM | POA: Insufficient documentation

## 2013-06-21 DIAGNOSIS — L299 Pruritus, unspecified: Secondary | ICD-10-CM | POA: Insufficient documentation

## 2013-06-21 DIAGNOSIS — R110 Nausea: Secondary | ICD-10-CM | POA: Insufficient documentation

## 2013-06-21 MED ORDER — HYDROXYZINE HCL 10 MG PO TABS
10.0000 mg | ORAL_TABLET | Freq: Once | ORAL | Status: AC
  Administered 2013-06-21: 10 mg via ORAL
  Filled 2013-06-21: qty 1

## 2013-06-21 MED ORDER — PREDNISONE (PAK) 10 MG PO TABS: 10.0000 mg | ORAL_TABLET | Freq: Every day | ORAL | Status: DC

## 2013-06-21 MED ORDER — IBUPROFEN 800 MG PO TABS: 800.0000 mg | ORAL_TABLET | Freq: Three times a day (TID) | ORAL | Status: DC | PRN

## 2013-06-21 MED ORDER — PREDNISONE 20 MG PO TABS
60.0000 mg | ORAL_TABLET | Freq: Once | ORAL | Status: AC
  Administered 2013-06-21: 60 mg via ORAL
  Filled 2013-06-21: qty 3

## 2013-06-21 MED ORDER — DIPHENHYDRAMINE HCL 25 MG PO TABS: 25.0000 mg | ORAL_TABLET | Freq: Four times a day (QID) | ORAL | Status: DC

## 2013-06-21 NOTE — ED Notes (Signed)
Pt from home c/o eye swelling, nasal congestion, and itching. Pt took 2 Benadryl before arrival. Pt states that she "dyed her hair" last night with color that she has never used before, but denies a change in other personal/home care products. Pt adds that she takes Methadone every morning, which she also took before arrival. Pt A&O and in NAD.

## 2013-06-21 NOTE — ED Provider Notes (Signed)
History    CSN: [id number] Arrival date & time 06/21/13  1143  First MD Initiated Contact with Patient 06/21/13 1149     Chief Complaint  Patient presents with  . Facial Swelling   (Consider location/radiation/quality/duration/timing/severity/associated sxs/prior Treatment) HPI Comments: Patient presents with itching and swelling over face and itching over head, ears, neck, arms, and upper torso.  States she woke up this morning with the symptoms.  Took 2 benadryl which improved the swelling but not the itching.  Itching and swelling are worst over the bridge of her nose.  States she also has nasal congestion with green discharge that started today.  Denies fevers, chills, sore throat, swelling or itching in her mouth or throat, difficulty swallowing or breathing.  States she dyed her hair last night using a new color and a new brand, which involved washing it off in the shower.    The history is provided by the patient and a significant other.   Past Medical History  Diagnosis Date  . ADHD (attention deficit hyperactivity disorder)   . Anxiety attack    History reviewed. No pertinent past surgical history. No family history on file. History  Substance Use Topics  . Smoking status: Current Some Day Smoker    Types: Cigarettes  . Smokeless tobacco: Not on file  . Alcohol Use: Yes     Comment: ocassionally   OB History   Grav Para Term Preterm Abortions TAB SAB Ect Mult Living                 Review of Systems  Constitutional: Negative for fever and chills.  HENT: Positive for congestion and facial swelling. Negative for ear pain, sore throat and trouble swallowing.   Respiratory: Negative for cough and shortness of breath.   Gastrointestinal: Positive for nausea. Negative for vomiting, abdominal pain and diarrhea.  Skin: Negative for color change and wound.    Allergies  Review of patient's allergies indicates no known allergies.  Home Medications   Current Outpatient  Rx  Name  Route  Sig  Dispense  Refill  . LORazepam (ATIVAN) 1 MG tablet   Oral   Take 1 tablet (1 mg total) by mouth 3 (three) times daily as needed for anxiety.   15 tablet   0    BP 111/79  Pulse 81  Temp(Src) 98.5 F (36.9 C) (Oral)  Resp 20  SpO2 99%  LMP 06/07/2013 Physical Exam  Nursing note and vitals reviewed. Constitutional: She appears well-developed and well-nourished. No distress.  HENT:  Head: Normocephalic and atraumatic.    Right Ear: Tympanic membrane and external ear normal.  Left Ear: Tympanic membrane and external ear normal.  Nose: No mucosal edema or rhinorrhea. Right sinus exhibits no maxillary sinus tenderness and no frontal sinus tenderness. Left sinus exhibits no maxillary sinus tenderness and no frontal sinus tenderness.  Mouth/Throat: Oropharynx is clear and moist. Mucous membranes are not dry. No oropharyngeal exudate, posterior oropharyngeal edema, posterior oropharyngeal erythema or tonsillar abscesses.    Left lower 3rd molar appears to be coming in.  No gingival erythema, edema, discharge.   Eyes: Conjunctivae and EOM are normal. Right eye exhibits no discharge. Left eye exhibits no discharge.  Neck: Normal range of motion. Neck supple.  Pulmonary/Chest: Effort normal.  Neurological: She is alert.  Skin: She is not diaphoretic.    ED Course  Procedures (including critical care time) Labs Reviewed - No data to display No results found.  1. Contact dermatitis  MDM  Pt with diffuse itching over head, neck, and upper torso without rash that began this morning.  She did have contact with a new chemical last night in the form of hair dye, this is likely causing the reaction.  She has some swelling around the bridge of her nose that is slightly tender, states it feels bruised, likely from the increased swelling.  No sinus tenderness.  Afebrile.  Nasal congestion is new since this morning.  Doubt sinusitis.  No airway concerns.  Pt d/c home  with prednisone and benadryl for contact dermatitis.  Discussed findings, treatment plan, follow up with patient.  Pt given return precautions.  Pt verbalizes understanding and agrees with plan.    I doubt any other EMC precluding discharge at this time including, but not necessarily limited to the following:  Sinusitis, dental abscess   Trixie Dredge, PA-C 06/21/13 1557

## 2013-06-22 NOTE — ED Provider Notes (Signed)
Medical screening examination/treatment/procedure(s) were performed by non-physician practitioner and as supervising physician I was immediately available for consultation/collaboration.   Laray Anger, DO 06/22/13 [phone]

## 2014-10-23 ENCOUNTER — Emergency Department (HOSPITAL_COMMUNITY)
Admission: EM | Admit: 2014-10-23 | Discharge: 2014-10-23 | Discharge status: Against Medical Advice | Payer: Worker's Compensation | Attending: Emergency Medicine | Admitting: Emergency Medicine

## 2014-10-23 ENCOUNTER — Encounter (HOSPITAL_COMMUNITY): Payer: Self-pay | Admitting: *Deleted

## 2014-10-23 DIAGNOSIS — Z720 Tobacco use: Secondary | ICD-10-CM | POA: Insufficient documentation

## 2014-10-23 DIAGNOSIS — Y9289 Other specified places as the place of occurrence of the external cause: Secondary | ICD-10-CM | POA: Insufficient documentation

## 2014-10-23 DIAGNOSIS — S0990XA Unspecified injury of head, initial encounter: Secondary | ICD-10-CM | POA: Diagnosis present

## 2014-10-23 DIAGNOSIS — Y9389 Activity, other specified: Secondary | ICD-10-CM | POA: Insufficient documentation

## 2014-10-23 DIAGNOSIS — W2203XA Walked into furniture, initial encounter: Secondary | ICD-10-CM | POA: Insufficient documentation
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
## 2014-10-27 ENCOUNTER — Encounter (HOSPITAL_COMMUNITY): Payer: Self-pay | Admitting: Emergency Medicine

## 2014-10-27 ENCOUNTER — Emergency Department (HOSPITAL_COMMUNITY)
Admission: EM | Admit: 2014-10-27 | Discharge: 2014-10-27 | Disposition: A | Discharge status: Home / Self Care | Payer: Worker's Compensation | Attending: Emergency Medicine | Admitting: Emergency Medicine

## 2014-10-27 DIAGNOSIS — Z79899 Other long term (current) drug therapy: Secondary | ICD-10-CM | POA: Insufficient documentation

## 2014-10-27 DIAGNOSIS — F419 Anxiety disorder, unspecified: Secondary | ICD-10-CM | POA: Diagnosis not present

## 2014-10-27 DIAGNOSIS — Z0000 Encounter for general adult medical examination without abnormal findings: Secondary | ICD-10-CM

## 2014-10-27 DIAGNOSIS — Z7952 Long term (current) use of systemic steroids: Secondary | ICD-10-CM | POA: Diagnosis not present

## 2014-10-27 DIAGNOSIS — Z720 Tobacco use: Secondary | ICD-10-CM | POA: Diagnosis not present

## 2014-10-27 DIAGNOSIS — Z0279 Encounter for issue of other medical certificate: Secondary | ICD-10-CM | POA: Diagnosis not present

## 2014-10-27 DIAGNOSIS — Z87828 Personal history of other (healed) physical injury and trauma: Secondary | ICD-10-CM | POA: Diagnosis not present
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
## 2017-07-23 ENCOUNTER — Encounter (HOSPITAL_COMMUNITY): Payer: Self-pay

## 2017-07-23 DIAGNOSIS — R079 Chest pain, unspecified: Secondary | ICD-10-CM | POA: Diagnosis present

## 2017-07-23 DIAGNOSIS — Z5321 Procedure and treatment not carried out due to patient leaving prior to being seen by health care provider: Secondary | ICD-10-CM | POA: Insufficient documentation

## 2017-07-23 LAB — BASIC METABOLIC PANEL
ANION GAP: 8 (ref 5–15)
BUN: 9 mg/dL (ref 6–20)
CALCIUM: 9.3 mg/dL (ref 8.9–10.3)
CO2: 26 mmol/L (ref 22–32)
CREATININE: 0.86 mg/dL (ref 0.44–1.00)
Chloride: 107 mmol/L (ref 101–111)
GFR calc Af Amer: >60 mL/min (ref >60)
GFR calc non Af Amer: >60 mL/min (ref >60)
GLUCOSE: 107 mg/dL — AB (ref 65–99)
Potassium: 3.4 mmol/L — ABNORMAL LOW (ref 3.5–5.1)
Sodium: 141 mmol/L (ref 135–145)

## 2017-07-23 LAB — CBC
HCT: 40.3 % (ref 36.0–46.0)
HEMOGLOBIN: 13.6 g/dL (ref 12.0–15.0)
MCH: 30.5 pg (ref 26.0–34.0)
MCHC: 33.7 g/dL (ref 30.0–36.0)
MCV: 90.4 fL (ref 78.0–100.0)
Platelets: 238 10*3/uL (ref 150–400)
RBC: 4.46 MIL/uL (ref 3.87–5.11)
RDW: 13.4 % (ref 11.5–15.5)
WBC: 8.9 10*3/uL (ref 4.0–10.5)

## 2017-07-23 LAB — I-STAT TROPONIN, ED: TROPONIN I, POC: 0 ng/mL (ref 0.00–0.08)

## 2017-07-23 NOTE — ED Notes (Signed)
Xray tech unable to locate pt for exam

## 2017-07-23 NOTE — ED Triage Notes (Signed)
Pt reports upper chest pain with upper back pain for the last 3 days. Pt states she has also had a dry cough. Pt appears anxious in triage.

## 2017-07-24 ENCOUNTER — Emergency Department (HOSPITAL_COMMUNITY)
Admission: EM | Admit: 2017-07-24 | Discharge: 2017-07-24 | Discharge status: Against Medical Advice | Payer: Medicaid Other | Attending: Emergency Medicine | Admitting: Emergency Medicine

## 2017-07-24 NOTE — ED Notes (Signed)
Pt was called for vitals re-check no answer.

## 2017-10-31 ENCOUNTER — Encounter (HOSPITAL_BASED_OUTPATIENT_CLINIC_OR_DEPARTMENT_OTHER): Payer: Self-pay | Admitting: Emergency Medicine

## 2017-10-31 ENCOUNTER — Emergency Department (HOSPITAL_BASED_OUTPATIENT_CLINIC_OR_DEPARTMENT_OTHER)
Admission: EM | Admit: 2017-10-31 | Discharge: 2017-10-31 | Disposition: A | Discharge status: Home / Self Care | Payer: Medicaid Other | Attending: Emergency Medicine | Admitting: Emergency Medicine

## 2017-10-31 ENCOUNTER — Other Ambulatory Visit: Payer: Self-pay

## 2017-10-31 DIAGNOSIS — N751 Abscess of Bartholin's gland: Secondary | ICD-10-CM

## 2017-10-31 DIAGNOSIS — F17210 Nicotine dependence, cigarettes, uncomplicated: Secondary | ICD-10-CM | POA: Diagnosis not present

## 2017-10-31 DIAGNOSIS — N898 Other specified noninflammatory disorders of vagina: Secondary | ICD-10-CM | POA: Diagnosis present

## 2017-10-31 MED ORDER — HYDROCODONE-ACETAMINOPHEN 5-325 MG PO TABS
1.0000 | ORAL_TABLET | Freq: Once | ORAL | Status: AC
  Administered 2017-10-31: 1 via ORAL
  Filled 2017-10-31: qty 1

## 2017-10-31 MED ORDER — HYDROCODONE-ACETAMINOPHEN 5-325 MG PO TABS: 1.0000 | ORAL_TABLET | Freq: Four times a day (QID) | ORAL | 0 refills | Status: DC | PRN

## 2017-10-31 MED ORDER — LIDOCAINE-EPINEPHRINE (PF) 2 %-1:200000 IJ SOLN
10.0000 mL | Freq: Once | INTRAMUSCULAR | Status: AC
  Administered 2017-10-31: 10 mL
  Filled 2017-10-31: qty 10

## 2017-10-31 NOTE — ED Triage Notes (Signed)
Patient states that she had a boil on her left inner thigh x 4 days

## 2017-10-31 NOTE — ED Provider Notes (Signed)
[facility] EMERGENCY DEPARTMENT Provider Note   CSN: [id number] Arrival date & time: 10/31/17  1531     History   Chief Complaint Chief Complaint  Patient presents with  . Recurrent Skin Infections    HPI Cheryl Green is a 26 y.o. female with past medical history of ADHD, anxiety, presenting to the ED with 4-5 days of a painful enlarging "cyst" to the genital region.  She has been treating with warm compresses, however without relief.  Denies fever or chills, nausea or vomiting, or history of immunocompromise.  The history is provided by the patient.    Past Medical History:  Diagnosis Date  . ADHD (attention deficit hyperactivity disorder)   . Anxiety attack     There are no active problems to display for this patient.   History reviewed. No pertinent surgical history.  OB History    No data available       Home Medications    Prior to Admission medications   Medication Sig Start Date End Date Taking? Authorizing Provider  diphenhydrAMINE (BENADRYL) 25 MG tablet Take 1 tablet (25 mg total) by mouth every 6 (six) hours. X 3 days, then as needed 06/21/13   ChadWest, Emily, PA-C  ibuprofen (ADVIL,MOTRIN) 800 MG tablet Take 1 tablet (800 mg total) by mouth every 8 (eight) hours as needed for pain. 06/21/13   Trixie DredgeWest, Emily, PA-C  LORazepam (ATIVAN) 1 MG tablet Take 1 tablet (1 mg total) by mouth 3 (three) times daily as needed for anxiety. 10/28/12   Dammen, Theron AristaPeter, PA-C  predniSONE (STERAPRED UNI-PAK) 10 MG tablet Take 1 tablet (10 mg total) by mouth daily. Day 1: take 6 tabs.  Day 2: 5 tabs  Day 3: 4 tabs  Day 4: 3 tabs  Day 5: 2 tabs  Day 6: 1 tab  Start 06/22/13 06/21/13   Trixie DredgeWest, Emily, PA-C    Family History History reviewed. No pertinent family history.  Social History Social History   Tobacco Use  . Smoking status: Current Some Day Smoker    Types: Cigarettes  . Smokeless tobacco: Never Used  Substance Use Topics  . Alcohol use: Yes    Comment:  ocassionally  . Drug use: No     Allergies   Patient has no known allergies.   Review of Systems Review of Systems  Constitutional: Negative for chills and fever.  Gastrointestinal: Negative for nausea and vomiting.  Skin: Positive for color change.     Physical Exam Updated Vital Signs BP 102/69 (BP Location: Right Arm)   Pulse 63   Temp 97.9 F (36.6 C) (Oral)   Resp 18   Ht 5\' 8"  (1.727 m)   Wt 49.9 kg (110 lb)   LMP 10/24/2017   SpO2 100%   BMI 16.73 kg/m   Physical Exam  Constitutional: She appears well-developed and well-nourished.  HENT:  Head: Normocephalic and atraumatic.  Eyes: Conjunctivae are normal.  Cardiovascular: Normal rate and intact distal pulses.  Pulmonary/Chest: Effort normal.  Genitourinary:    There is tenderness on the left labia.  Genitourinary Comments: Exam performed with chaperone present.  Tender erythematous fluctuant mass to left lower labia majora.  Not actively draining.  No significant surrounding cellulitis.  Psychiatric: She has a normal mood and affect. Her behavior is normal.  Nursing note and vitals reviewed.    ED Treatments / Results  Labs (all labs ordered are listed, but only abnormal results are displayed) Labs Reviewed - No data to display  EKG  EKG Interpretation None       Radiology No results found.  Procedures .Marland Kitchen.Incision and Drainage Date/Time: 10/31/2017 5:23 PM Performed by: Solomia Harrell, [place] N, PA-C Authorized by: Jesiel Garate, [place] N, PA-C   Consent:    Consent obtained:  Verbal   Consent given by:  Patient   Risks discussed:  Bleeding, incomplete drainage, pain and damage to other organs   Alternatives discussed:  No treatment and alternative treatment Location:    Type:  Bartholin cyst   Size:  2.5cm   Location:  Anogenital   Anogenital location:  Vulva Pre-procedure details:    Skin preparation:  Betadine Anesthesia (see MAR for exact dosages):    Anesthesia method:  Local  infiltration   Local anesthetic:  Lidocaine 1% WITH epi Procedure type:    Complexity:  Simple Procedure details:    Needle aspiration: no     Incision types:  Single straight   Incision depth:  Dermal   Scalpel blade:  11   Wound management:  Probed and deloculated and irrigated with saline   Drainage:  Bloody and purulent (cystic)   Drainage amount:  Moderate   Wound treatment:  Wound left open   Packing materials:  None Post-procedure details:    Patient tolerance of procedure:  Tolerated well, no immediate complications    (including critical care time)  Medications Ordered in ED Medications  HYDROcodone-acetaminophen (NORCO/VICODIN) 5-325 MG per tablet 1 tablet (not administered)  lidocaine-EPINEPHrine (XYLOCAINE W/EPI) 2 %-1:200000 (PF) injection 10 mL (10 mLs Infiltration Given by Other 10/31/17 1731)     Initial Impression / Assessment and Plan / ED Course  I have reviewed the triage vital signs and the nursing notes.  Pertinent labs & imaging results that were available during my care of the patient were reviewed by me and considered in my medical decision making (see chart for details).     Patient with bartholin gland cyst/abscess to labia amenable to incision and drainage. Purulent fluid as well as cystic fluid drained. No systemic symptoms. Abscess was not large enough to warrant packing or drain,  wound recheck in 2 days. Encouraged home warm soaks and flushing.  Mild signs of cellulitis is surrounding skin.  Will [facility].  No antibiotic therapy is indicated. Pt is safe for discharge.  [place] reporting System queried  Discussed results, findings, treatment and follow up. Patient advised of return precautions. Patient verbalized understanding and agreed with plan.  Final Clinical Impressions(s) / ED Diagnoses   Final diagnoses:  Bartholin's gland abscess    ED Discharge Orders    None         Tujuana Kilmartin, [place] N, PA-C 10/31/17 1805    Tegeler, Canary Brimhristopher J, MD 11/01/17 [phone]

## 2017-10-31 NOTE — Discharge Instructions (Addendum)
Please read instructions below.  Keep your wound clean and covered. Soak/flush your wound with warm water, multiple times per day. You can take Advil/ibuprofen every 6 hours as needed for pain. You can take hydrocodone every 6 hours as needed for severe pain. Do not drive, take tylenol, or drink alcohol while taking this medication. Follow up with your primary care, urgent care, or return to this ER for wound recheck in 2 days.  Schedule an appointment with the [facility] to follow up on your cyst. It may return and you may need to have it removed.  Return to the ER for fever, worsening redness, or new or worsening symptoms.

## 2018-06-05 ENCOUNTER — Emergency Department (HOSPITAL_COMMUNITY)
Admission: EM | Admit: 2018-06-05 | Discharge: 2018-06-05 | Disposition: A | Discharge status: Home / Self Care | Payer: Medicaid Other | Attending: Emergency Medicine | Admitting: Emergency Medicine

## 2018-06-05 ENCOUNTER — Other Ambulatory Visit: Payer: Self-pay

## 2018-06-05 ENCOUNTER — Encounter (HOSPITAL_COMMUNITY): Payer: Self-pay

## 2018-06-05 DIAGNOSIS — K029 Dental caries, unspecified: Secondary | ICD-10-CM | POA: Diagnosis not present

## 2018-06-05 DIAGNOSIS — F909 Attention-deficit hyperactivity disorder, unspecified type: Secondary | ICD-10-CM | POA: Insufficient documentation

## 2018-06-05 DIAGNOSIS — K0889 Other specified disorders of teeth and supporting structures: Secondary | ICD-10-CM | POA: Diagnosis present

## 2018-06-05 DIAGNOSIS — Z79899 Other long term (current) drug therapy: Secondary | ICD-10-CM | POA: Insufficient documentation

## 2018-06-05 DIAGNOSIS — F17210 Nicotine dependence, cigarettes, uncomplicated: Secondary | ICD-10-CM | POA: Insufficient documentation

## 2018-06-05 MED ORDER — TRAMADOL HCL 50 MG PO TABS
100.0000 mg | ORAL_TABLET | Freq: Once | ORAL | Status: AC
  Administered 2018-06-05: 100 mg via ORAL
  Filled 2018-06-05: qty 2

## 2018-06-05 MED ORDER — IBUPROFEN 400 MG PO TABS: 400.0000 mg | ORAL_TABLET | Freq: Four times a day (QID) | ORAL | 0 refills | Status: DC | PRN

## 2018-06-05 MED ORDER — IBUPROFEN 200 MG PO TABS
400.0000 mg | ORAL_TABLET | Freq: Once | ORAL | Status: AC
  Administered 2018-06-05: 400 mg via ORAL
  Filled 2018-06-05: qty 2

## 2018-06-05 MED ORDER — AMOXICILLIN 500 MG PO CAPS
500.0000 mg | ORAL_CAPSULE | Freq: Once | ORAL | Status: AC
  Administered 2018-06-05: 500 mg via ORAL
  Filled 2018-06-05: qty 1

## 2018-06-05 MED ORDER — ONDANSETRON HCL 4 MG PO TABS
4.0000 mg | ORAL_TABLET | Freq: Once | ORAL | Status: AC
  Administered 2018-06-05: 4 mg via ORAL
  Filled 2018-06-05: qty 1

## 2018-06-05 MED ORDER — AMOXICILLIN 500 MG PO CAPS: 500.0000 mg | ORAL_CAPSULE | Freq: Three times a day (TID) | ORAL | 0 refills | Status: DC

## 2018-06-05 NOTE — Discharge Instructions (Addendum)
Orajel may be helpful for the pain areas of your mouth.  Please use ibuprofen with breakfast, lunch, dinner, and at bedtime.  Use Amoxil 3 times daily with food.  Please see your dentist as soon as possible.

## 2018-06-05 NOTE — ED Triage Notes (Signed)
Patient presents with right sided upper and lower molar dental pain. Patient reports her upper right molar "fell out" a month ago approximately, and her right lower molar "half of it fell out 2 weeks ago, but the other half is still in there." Patient reports intermittent pain, swelling, and fevers. Patient unsure why her teeth are falling out. Patient reports taking ibuprofen and swishing with salt water for pain. Patient reports she has an appointment on Wednesday 06/11/18 with her dentist.

## 2018-06-05 NOTE — ED Provider Notes (Signed)
[facility]-EMERGENCY DEPT Provider Note   CSN: [id number] Arrival date & time: 06/05/18  1906     History   Chief Complaint Chief Complaint  Patient presents with  . Dental Pain    HPI Cheryl Green is a 27 y.o. female.  Pt reports fever, chills and swollen lymph nodes.   The history is provided by the patient.  Dental Pain   This is a chronic problem. The current episode started more than 1 week ago. The problem occurs hourly. The problem has been gradually worsening. The pain is moderate. She has tried nothing for the symptoms.    Past Medical History:  Diagnosis Date  . ADHD (attention deficit hyperactivity disorder)   . Anxiety attack     There are no active problems to display for this patient.   History reviewed. No pertinent surgical history.   OB History   None      Home Medications    Prior to Admission medications   Medication Sig Start Date End Date Taking? Authorizing Provider  diphenhydrAMINE (BENADRYL) 25 MG tablet Take 1 tablet (25 mg total) by mouth every 6 (six) hours. X 3 days, then as needed 06/21/13   ChadWest, Emily, PA-C  HYDROcodone-acetaminophen (NORCO/VICODIN) 5-325 MG tablet Take 1-2 tablets every 6 (six) hours as needed by mouth for severe pain. 10/31/17   Robinson, [place] N, PA-C  ibuprofen (ADVIL,MOTRIN) 800 MG tablet Take 1 tablet (800 mg total) by mouth every 8 (eight) hours as needed for pain. 06/21/13   Trixie DredgeWest, Emily, PA-C  LORazepam (ATIVAN) 1 MG tablet Take 1 tablet (1 mg total) by mouth 3 (three) times daily as needed for anxiety. 10/28/12   Dammen, Theron AristaPeter, PA-C  predniSONE (STERAPRED UNI-PAK) 10 MG tablet Take 1 tablet (10 mg total) by mouth daily. Day 1: take 6 tabs.  Day 2: 5 tabs  Day 3: 4 tabs  Day 4: 3 tabs  Day 5: 2 tabs  Day 6: 1 tab  Start 06/22/13 06/21/13   Trixie DredgeWest, Emily, PA-C    Family History History reviewed. No pertinent family history.  Social History Social History   Tobacco Use  . Smoking  status: Current Some Day Smoker    Types: Cigarettes  . Smokeless tobacco: Never Used  Substance Use Topics  . Alcohol use: Yes    Comment: ocassionally  . Drug use: No     Allergies   Patient has no known allergies.   Review of Systems Review of Systems  Constitutional: Positive for chills and fever. Negative for activity change.       All ROS Neg except as noted in HPI  HENT: Positive for dental problem. Negative for nosebleeds.   Eyes: Negative for photophobia and discharge.  Respiratory: Negative for cough, shortness of breath and wheezing.   Cardiovascular: Negative for chest pain and palpitations.  Gastrointestinal: Negative for abdominal pain and blood in stool.  Genitourinary: Negative for dysuria, frequency and hematuria.  Musculoskeletal: Negative for arthralgias, back pain and neck pain.  Skin: Negative.   Neurological: Positive for headaches. Negative for dizziness, seizures and speech difficulty.  Psychiatric/Behavioral: Negative for confusion and hallucinations.     Physical Exam Updated Vital Signs BP 111/64 (BP Location: Right Arm)   Pulse 60   Temp 98.3 F (36.8 C) (Oral)   Resp 18   Ht 5\' 7"  (1.702 m)   Wt 49.9 kg (110 lb)   LMP 05/05/2018   SpO2 100%   BMI 17.23 kg/m  Physical Exam  Constitutional: She is oriented to person, place, and time. She appears well-developed and well-nourished.  Non-toxic appearance.  HENT:  Head: Normocephalic.  Right Ear: Tympanic membrane and external ear normal.  Left Ear: Tympanic membrane and external ear normal.  There is pain and some swelling of the right upper and lower posterior molar area.  There are dental caries present.  There is no visible abscess noted.  The airway is patent.  There is no swelling under the tongue.  Eyes: Pupils are equal, round, and reactive to light. EOM and lids are normal.  Neck: Normal range of motion. Neck supple. Carotid bruit is not present.  Cardiovascular: Normal rate,  regular rhythm, normal heart sounds, intact distal pulses and normal pulses.  Pulmonary/Chest: Breath sounds normal. No respiratory distress.  Abdominal: Soft. Bowel sounds are normal. There is no tenderness. There is no guarding.  Musculoskeletal: Normal range of motion.  Lymphadenopathy:       Head (right side): No submandibular adenopathy present.       Head (left side): No submandibular adenopathy present.    She has no cervical adenopathy.  Neurological: She is alert and oriented to person, place, and time. She has normal strength. No cranial nerve deficit or sensory deficit.  Skin: Skin is warm and dry.  Psychiatric: She has a normal mood and affect. Her speech is normal.  Nursing note and vitals reviewed.    ED Treatments / Results  Labs (all labs ordered are listed, but only abnormal results are displayed) Labs Reviewed - No data to display  EKG None  Radiology No results found.  Procedures Procedures (including critical care time)  Medications Ordered in ED Medications - No data to display   Initial Impression / Assessment and Plan / ED Course  I have reviewed the triage vital signs and the nursing notes.  Pertinent labs & imaging results that were available during my care of the patient were reviewed by me and considered in my medical decision making (see chart for details).       Final Clinical Impressions(s) / ED Diagnoses MDM  Vital signs within normal limits.  Pulse oximetry is 100% on room air.  Within normal limits by my interpretation.  Patient has some pain and swelling of the upper and lower gums on the right.  There is no visible abscess appreciated at this time.  The airway is patent, there is no swelling under the tongue.  Doubt Ludwig's angina.  There is no excessive facial swelling.  There are no hot areas about the face.  Patient has dental caries that has been an ongoing issue.  Patient will be treated with antibiotics and anti-inflammatory  pain medication.  Patient given resources for dental assistance.  Patient strongly encouraged to see a dentist as soon as possible.  Patient is in agreement with this plan.   Final diagnoses:  Dental caries  Pain, dental    ED Discharge Orders        Ordered    amoxicillin (AMOXIL) 500 MG capsule  3 times daily     06/05/18 1954    ibuprofen (ADVIL,MOTRIN) 400 MG tablet  Every 6 hours PRN     06/05/18 1954       Ivery Quale, PA-C 06/06/18 1710    Mancel Bale, MD 06/09/18 1526

## 2018-08-22 ENCOUNTER — Encounter (HOSPITAL_BASED_OUTPATIENT_CLINIC_OR_DEPARTMENT_OTHER): Payer: Self-pay

## 2018-08-22 ENCOUNTER — Other Ambulatory Visit: Payer: Self-pay

## 2018-08-22 ENCOUNTER — Emergency Department (HOSPITAL_BASED_OUTPATIENT_CLINIC_OR_DEPARTMENT_OTHER)
Admission: EM | Admit: 2018-08-22 | Discharge: 2018-08-22 | Disposition: A | Discharge status: Home / Self Care | Payer: Medicaid Other | Attending: Emergency Medicine | Admitting: Emergency Medicine

## 2018-08-22 DIAGNOSIS — Z79899 Other long term (current) drug therapy: Secondary | ICD-10-CM | POA: Insufficient documentation

## 2018-08-22 DIAGNOSIS — L03211 Cellulitis of face: Secondary | ICD-10-CM | POA: Insufficient documentation

## 2018-08-22 DIAGNOSIS — R600 Localized edema: Secondary | ICD-10-CM | POA: Diagnosis present

## 2018-08-22 DIAGNOSIS — F17210 Nicotine dependence, cigarettes, uncomplicated: Secondary | ICD-10-CM | POA: Insufficient documentation

## 2018-08-22 MED ORDER — CLINDAMYCIN HCL 150 MG PO CAPS: 300.0000 mg | ORAL_CAPSULE | Freq: Three times a day (TID) | ORAL | 0 refills | Status: DC

## 2018-08-22 MED ORDER — MELOXICAM 15 MG PO TABS: 15.0000 mg | ORAL_TABLET | Freq: Every day | ORAL | 0 refills | Status: DC

## 2018-08-22 NOTE — ED Provider Notes (Signed)
[facility] EMERGENCY DEPARTMENT Provider Note   CSN: [id number] Arrival date & time: 08/22/18  1947     History   Chief Complaint Chief Complaint  Patient presents with  . Abscess    Face    HPI Cheryl Green is a 27 y.o. female presents the emergency department for evaluation of facial infection.  Patient had a pimple in between her eyebrows which she popped twice.  Over the past 24 hours she has worsening swelling and erythema that extends below both of her eye orbits.  She denies pain with eye movement, nausea, vomiting, fevers or chills.  She denies changes in vision.  HPI  Past Medical History:  Diagnosis Date  . ADHD (attention deficit hyperactivity disorder)   . Anxiety attack     There are no active problems to display for this patient.   History reviewed. No pertinent surgical history.   OB History   None      Home Medications    Prior to Admission medications   Medication Sig Start Date End Date Taking? Authorizing Provider  amoxicillin (AMOXIL) 500 MG capsule Take 1 capsule (500 mg total) by mouth 3 (three) times daily. 06/05/18   Ivery Quale, PA-C  clindamycin (CLEOCIN) 150 MG capsule Take 2 capsules (300 mg total) by mouth 3 (three) times daily. May dispense as 150mg  capsules 08/22/18   Arthor Captain, PA-C  diphenhydrAMINE (BENADRYL) 25 MG tablet Take 1 tablet (25 mg total) by mouth every 6 (six) hours. X 3 days, then as needed 06/21/13   Chad, Emily, PA-C  HYDROcodone-acetaminophen (NORCO/VICODIN) 5-325 MG tablet Take 1-2 tablets every 6 (six) hours as needed by mouth for severe pain. 10/31/17   Robinson, [place] N, PA-C  ibuprofen (ADVIL,MOTRIN) 400 MG tablet Take 1 tablet (400 mg total) by mouth every 6 (six) hours as needed. 06/05/18   Ivery Quale, PA-C  LORazepam (ATIVAN) 1 MG tablet Take 1 tablet (1 mg total) by mouth 3 (three) times daily as needed for anxiety. 10/28/12   Ivonne Andrew, PA-C  meloxicam (MOBIC) 15 MG tablet Take 1  tablet (15 mg total) by mouth daily. Take 1 daily with food. 08/22/18   Ziare Orrick, PA-C  predniSONE (STERAPRED UNI-PAK) 10 MG tablet Take 1 tablet (10 mg total) by mouth daily. Day 1: take 6 tabs.  Day 2: 5 tabs  Day 3: 4 tabs  Day 4: 3 tabs  Day 5: 2 tabs  Day 6: 1 tab  Start 06/22/13 06/21/13   Trixie Dredge, PA-C    Family History History reviewed. No pertinent family history.  Social History Social History   Tobacco Use  . Smoking status: Current Some Day Smoker    Types: Cigarettes  . Smokeless tobacco: Never Used  Substance Use Topics  . Alcohol use: Yes    Comment: ocassionally  . Drug use: No     Allergies   Patient has no known allergies.   Review of Systems Review of Systems Ten systems reviewed and are negative for acute change, except as noted in the HPI.    Physical Exam Updated Vital Signs BP 110/71 (BP Location: Right Arm)   Pulse 76   Temp 97.9 F (36.6 C) (Oral)   Resp 16   Ht 5\' 7"  (1.702 m)   Wt 51.5 kg   LMP 08/04/2018   SpO2 99%   BMI 17.78 kg/m   Physical Exam  Constitutional: She is oriented to person, place, and time. She appears well-developed and well-nourished.  No distress.  HENT:  Head: Normocephalic and atraumatic.    Eyes: Conjunctivae are normal. No scleral icterus.  Neck: Normal range of motion.  Cardiovascular: Normal rate, regular rhythm and normal heart sounds. Exam reveals no gallop and no friction rub.  No murmur heard. Pulmonary/Chest: Effort normal and breath sounds normal. No respiratory distress.  Abdominal: Soft. Bowel sounds are normal. She exhibits no distension and no mass. There is no tenderness. There is no guarding.  Neurological: She is alert and oriented to person, place, and time.  Skin: Skin is warm and dry. She is not diaphoretic.  Psychiatric: Her behavior is normal.  Nursing note and vitals reviewed.    ED Treatments / Results  Labs (all labs ordered are listed, but only abnormal results are  displayed) Labs Reviewed - No data to display  EKG None  Radiology No results found.  Procedures Procedures (including critical care time)  Medications Ordered in ED Medications - No data to display   Initial Impression / Assessment and Plan / ED Course  I have reviewed the triage vital signs and the nursing notes.  Pertinent labs & imaging results that were available during my care of the patient were reviewed by me and considered in my medical decision making (see chart for details).     Patient with facial cellulitis and preseptal cellulitis.  No evidence of orbital cellulitis.  The patient will be discharged with clindamycin.  Encouraged warm moist compresses to the region between her eyebrows.  Discussed return precautions.  She appears appropriate for discharge at this time  Final Clinical Impressions(s) / ED Diagnoses   Final diagnoses:  Facial cellulitis    ED Discharge Orders         Ordered    clindamycin (CLEOCIN) 150 MG capsule  3 times daily     08/22/18 2106    meloxicam (MOBIC) 15 MG tablet  Daily     08/22/18 2106           Arthor Captain, PA-C 08/23/18 0024    Little, Ambrose Finland, MD 08/23/18 1452

## 2018-08-22 NOTE — Discharge Instructions (Signed)
Contact a health care provider if: You have a fever. Your eyelids become more red, warm, or swollen. You have new symptoms. Your symptoms do not get better with treatment. Get help right away if: You develop double vision, or your vision becomes blurred or worsens in any way. You have trouble moving your eyes. Your eye looks like it is sticking out or bulging out (proptosis). You develop a severe headache, severe neck pain, or neck stiffness. You develop repeated vomiting.

## 2018-08-22 NOTE — ED Triage Notes (Signed)
Pt c/o of facial abscess located between her eyebrows. Pt states she has "popped it multiple times". Pt has redness and swelling under eyes a forehead. Pt A+OX4, NAD.

## 2018-08-23 ENCOUNTER — Encounter (HOSPITAL_COMMUNITY): Payer: Self-pay | Admitting: Emergency Medicine

## 2018-08-23 ENCOUNTER — Other Ambulatory Visit: Payer: Self-pay

## 2018-08-23 ENCOUNTER — Emergency Department (HOSPITAL_COMMUNITY)
Admission: EM | Admit: 2018-08-23 | Discharge: 2018-08-23 | Disposition: A | Discharge status: Home / Self Care | Payer: Medicaid Other | Attending: Emergency Medicine | Admitting: Emergency Medicine

## 2018-08-23 DIAGNOSIS — L03211 Cellulitis of face: Secondary | ICD-10-CM

## 2018-08-23 DIAGNOSIS — Z79899 Other long term (current) drug therapy: Secondary | ICD-10-CM | POA: Insufficient documentation

## 2018-08-23 DIAGNOSIS — F17210 Nicotine dependence, cigarettes, uncomplicated: Secondary | ICD-10-CM | POA: Insufficient documentation

## 2018-08-23 DIAGNOSIS — L0201 Cutaneous abscess of face: Secondary | ICD-10-CM | POA: Diagnosis present

## 2018-08-23 DIAGNOSIS — F909 Attention-deficit hyperactivity disorder, unspecified type: Secondary | ICD-10-CM | POA: Diagnosis not present

## 2018-08-23 MED ORDER — HYDROCODONE-ACETAMINOPHEN 5-325 MG PO TABS
1.0000 | ORAL_TABLET | Freq: Once | ORAL | Status: AC
  Administered 2018-08-23: 1 via ORAL
  Filled 2018-08-23: qty 1

## 2018-08-23 MED ORDER — CLINDAMYCIN HCL 150 MG PO CAPS
300.0000 mg | ORAL_CAPSULE | Freq: Once | ORAL | Status: AC
  Administered 2018-08-23: 300 mg via ORAL
  Filled 2018-08-23: qty 2

## 2018-08-23 NOTE — ED Provider Notes (Signed)
[facility] EMERGENCY DEPARTMENT Provider Note   CSN: [id number] Arrival date & time: 08/23/18  0250     History   Chief Complaint Chief Complaint  Patient presents with  . Abscess    HPI Cheryl Green is a 27 y.o. female.  Patient presents for complaint of facial pain, swelling and redness. Diagnosed with facial cellulitis yesterday at [facility] and prescribed Cleosin and Mobic that she has been unable to fill yet. She presents here with complaint of worsening swelling under her eyes and pain that is uncontrolled. No fever or other new/different symptoms.   The history is provided by the patient and a significant other. No language interpreter was used.    Past Medical History:  Diagnosis Date  . ADHD (attention deficit hyperactivity disorder)   . Anxiety attack     There are no active problems to display for this patient.   History reviewed. No pertinent surgical history.   OB History   None      Home Medications    Prior to Admission medications   Medication Sig Start Date End Date Taking? Authorizing Provider  amoxicillin (AMOXIL) 500 MG capsule Take 1 capsule (500 mg total) by mouth 3 (three) times daily. 06/05/18   Ivery Quale, PA-C  clindamycin (CLEOCIN) 150 MG capsule Take 2 capsules (300 mg total) by mouth 3 (three) times daily. May dispense as 150mg  capsules 08/22/18   Arthor Captain, PA-C  diphenhydrAMINE (BENADRYL) 25 MG tablet Take 1 tablet (25 mg total) by mouth every 6 (six) hours. X 3 days, then as needed 06/21/13   Chad, Emily, PA-C  HYDROcodone-acetaminophen (NORCO/VICODIN) 5-325 MG tablet Take 1-2 tablets every 6 (six) hours as needed by mouth for severe pain. 10/31/17   Robinson, [place] N, PA-C  ibuprofen (ADVIL,MOTRIN) 400 MG tablet Take 1 tablet (400 mg total) by mouth every 6 (six) hours as needed. 06/05/18   Ivery Quale, PA-C  LORazepam (ATIVAN) 1 MG tablet Take 1 tablet (1 mg total) by mouth 3 (three) times daily as  needed for anxiety. 10/28/12   Ivonne Andrew, PA-C  meloxicam (MOBIC) 15 MG tablet Take 1 tablet (15 mg total) by mouth daily. Take 1 daily with food. 08/22/18   Harris, Abigail, PA-C  predniSONE (STERAPRED UNI-PAK) 10 MG tablet Take 1 tablet (10 mg total) by mouth daily. Day 1: take 6 tabs.  Day 2: 5 tabs  Day 3: 4 tabs  Day 4: 3 tabs  Day 5: 2 tabs  Day 6: 1 tab  Start 06/22/13 06/21/13   Trixie Dredge, PA-C    Family History No family history on file.  Social History Social History   Tobacco Use  . Smoking status: Current Some Day Smoker    Types: Cigarettes  . Smokeless tobacco: Never Used  Substance Use Topics  . Alcohol use: Yes    Comment: ocassionally  . Drug use: No     Allergies   Patient has no known allergies.   Review of Systems Review of Systems  Constitutional: Negative for chills and fever.  HENT: Positive for facial swelling.        See HPI.  Eyes: Positive for pain and discharge.  Gastrointestinal: Negative for nausea.     Physical Exam Updated Vital Signs BP 101/61 (BP Location: Right Arm)   Pulse (!) 53   Temp 98.3 F (36.8 C) (Oral)   Resp 16   Ht 5\' 7"  (1.702 m)   Wt 51.3 kg   LMP  08/04/2018   SpO2 99%   BMI 17.70 kg/m   Physical Exam  Constitutional: She is oriented to person, place, and time. She appears well-developed and well-nourished.  HENT:  Mild swelling in glabellar facial region with significant tenderness at the site of an opened small lesion, by history a pimple the patient opened at home prior to onset of swelling and pain. No fluctuance There is mild swelling that extends under eyes bilaterally.  Eyes: Pupils are equal, round, and reactive to light. Conjunctivae and EOM are normal.  Full pain free ROM of eyes.  Neck: Normal range of motion. Neck supple.  Pulmonary/Chest: Effort normal.  Lymphadenopathy:    She has no cervical adenopathy.  Neurological: She is alert and oriented to person, place, and time.  Skin: Skin is warm and  dry.     ED Treatments / Results  Labs (all labs ordered are listed, but only abnormal results are displayed) Labs Reviewed - No data to display  EKG None  Radiology No results found.  Procedures Procedures (including critical care time)  Medications Ordered in ED Medications  HYDROcodone-acetaminophen (NORCO/VICODIN) 5-325 MG per tablet 1 tablet (has no administration in time range)  clindamycin (CLEOCIN) capsule 300 mg (has no administration in time range)     Initial Impression / Assessment and Plan / ED Course  I have reviewed the triage vital signs and the nursing notes.  Pertinent labs & imaging results that were available during my care of the patient were reviewed by me and considered in my medical decision making (see chart for details).     Patient with diagnosed cellulitis yesterday at site of a pimple she popped at home, presents with uncontrolled symptoms having been unable to fill her prescriptions yet. She reports the swelling is worse.   VSS. There is swelling extending to inferior orbits but no evidence orbital cellulitis. Tenderness is isolated to glabellar area at the site of pimple with secondary infection.   Dose Cleosin provided here, one Norco for pain. She will fill prescriptions later this morning as planned.   Final Clinical Impressions(s) / ED Diagnoses   Final diagnoses:  None   1. Facial cellulitis  ED Discharge Orders    None       Elpidio Anis, Cordelia Poche 08/23/18 6433    Palumbo, April, MD 08/28/18 0006

## 2018-08-23 NOTE — ED Notes (Signed)
PA at bedside evaluating pt.'s skin abscess.

## 2018-08-23 NOTE — ED Triage Notes (Addendum)
C/o abscess between eyebrows x 2 days.  Reports swelling under eyes that started yesterday.  Denies fever.  Pt seen earlier tonight at [facility] and given Rx for antibiotics.

## 2018-11-08 ENCOUNTER — Other Ambulatory Visit: Payer: Self-pay

## 2018-11-08 ENCOUNTER — Encounter (HOSPITAL_BASED_OUTPATIENT_CLINIC_OR_DEPARTMENT_OTHER): Payer: Self-pay | Admitting: Adult Health

## 2018-11-08 ENCOUNTER — Emergency Department (HOSPITAL_BASED_OUTPATIENT_CLINIC_OR_DEPARTMENT_OTHER)
Admission: EM | Admit: 2018-11-08 | Discharge: 2018-11-08 | Disposition: A | Discharge status: Home / Self Care | Payer: Medicaid Other | Attending: Emergency Medicine | Admitting: Emergency Medicine

## 2018-11-08 DIAGNOSIS — F909 Attention-deficit hyperactivity disorder, unspecified type: Secondary | ICD-10-CM | POA: Diagnosis not present

## 2018-11-08 DIAGNOSIS — H9192 Unspecified hearing loss, left ear: Secondary | ICD-10-CM | POA: Insufficient documentation

## 2018-11-08 DIAGNOSIS — H6122 Impacted cerumen, left ear: Secondary | ICD-10-CM | POA: Diagnosis not present

## 2018-11-08 DIAGNOSIS — H9202 Otalgia, left ear: Secondary | ICD-10-CM | POA: Diagnosis present

## 2018-11-08 DIAGNOSIS — F17210 Nicotine dependence, cigarettes, uncomplicated: Secondary | ICD-10-CM | POA: Insufficient documentation

## 2018-11-08 MED ORDER — ACETAMINOPHEN 325 MG PO TABS
650.0000 mg | ORAL_TABLET | Freq: Once | ORAL | Status: AC
  Administered 2018-11-08: 650 mg via ORAL
  Filled 2018-11-08: qty 2

## 2018-11-08 NOTE — ED Provider Notes (Signed)
[facility] EMERGENCY DEPARTMENT Provider Note   CSN: [id number] Arrival date & time: 11/08/18  1638     History   Chief Complaint Chief Complaint  Patient presents with  . Otalgia    HPI Cheryl Green is a 27 y.o. female presents today for left ear pain and hearing loss.  Patient states that upon waking this morning she had left ear pain that was sharp moderate intensity constant and worsened with touching the ear.  Patient states that times are muffled in the left ear.  Patient has not tried anything for her symptoms and states that it has not improved since this morning.  Additionally patient endorses right ear pain that occurred yesterday and was brief and self resolving.  Patient states that she has also had rhinorrhea and congestion for the past 3 days that have self resolved as of yesterday.  Patient denies fever, dizziness, nausea/vomiting, abdominal pain, vision changes, trouble swallowing/breathing or other concerns today aside from left ear pain and hearing loss.  Initial examination shows complete cerumen impaction of the left ear.  HPI  Past Medical History:  Diagnosis Date  . ADHD (attention deficit hyperactivity disorder)   . Anxiety attack     There are no active problems to display for this patient.   History reviewed. No pertinent surgical history.   OB History   None      Home Medications    Prior to Admission medications   Medication Sig Start Date End Date Taking? Authorizing Provider  amoxicillin (AMOXIL) 500 MG capsule Take 1 capsule (500 mg total) by mouth 3 (three) times daily. 06/05/18   Ivery QualeBryant, Hobson, PA-C  clindamycin (CLEOCIN) 150 MG capsule Take 2 capsules (300 mg total) by mouth 3 (three) times daily. May dispense as 150mg  capsules 08/22/18   Arthor CaptainHarris, Abigail, PA-C  diphenhydrAMINE (BENADRYL) 25 MG tablet Take 1 tablet (25 mg total) by mouth every 6 (six) hours. X 3 days, then as needed 06/21/13   ChadWest, Emily, PA-C    HYDROcodone-acetaminophen (NORCO/VICODIN) 5-325 MG tablet Take 1-2 tablets every 6 (six) hours as needed by mouth for severe pain. 10/31/17   Robinson, [place] N, PA-C  ibuprofen (ADVIL,MOTRIN) 400 MG tablet Take 1 tablet (400 mg total) by mouth every 6 (six) hours as needed. 06/05/18   Ivery QualeBryant, Hobson, PA-C  LORazepam (ATIVAN) 1 MG tablet Take 1 tablet (1 mg total) by mouth 3 (three) times daily as needed for anxiety. 10/28/12   Ivonne Andrewammen, Peter, PA-C  meloxicam (MOBIC) 15 MG tablet Take 1 tablet (15 mg total) by mouth daily. Take 1 daily with food. 08/22/18   Harris, Abigail, PA-C  predniSONE (STERAPRED UNI-PAK) 10 MG tablet Take 1 tablet (10 mg total) by mouth daily. Day 1: take 6 tabs.  Day 2: 5 tabs  Day 3: 4 tabs  Day 4: 3 tabs  Day 5: 2 tabs  Day 6: 1 tab  Start 06/22/13 06/21/13   Trixie DredgeWest, Emily, PA-C    Family History History reviewed. No pertinent family history.  Social History Social History   Tobacco Use  . Smoking status: Current Some Day Smoker    Types: Cigarettes  . Smokeless tobacco: Never Used  Substance Use Topics  . Alcohol use: Yes    Comment: ocassionally  . Drug use: No     Allergies   Patient has no known allergies.   Review of Systems Review of Systems  Constitutional: Negative.  Negative for chills and fever.  HENT: Positive for congestion,  ear pain, hearing loss and rhinorrhea. Negative for facial swelling, sinus pressure, sinus pain, sore throat, trouble swallowing and voice change.   Eyes: Negative.  Negative for pain and visual disturbance.  Respiratory: Negative.  Negative for cough.   Gastrointestinal: Negative.  Negative for abdominal pain, nausea and vomiting.  Neurological: Negative.  Negative for dizziness, weakness, numbness and headaches.   Physical Exam Updated Vital Signs BP 108/61 (BP Location: Right Arm)   Pulse 63   Temp 98.2 F (36.8 C) (Oral)   Resp 18   Ht 5\' 7"  (1.702 m)   Wt 50.8 kg   LMP 11/06/2018 (Exact Date)   SpO2 100%   BMI  17.54 kg/m   Physical Exam  Constitutional: She appears well-developed and well-nourished. No distress.  HENT:  Head: Normocephalic and atraumatic.  Right Ear: Hearing, tympanic membrane, external ear and ear canal normal. No mastoid tenderness. Tympanic membrane is not perforated.  Left Ear: Hearing, tympanic membrane, external ear and ear canal normal. No mastoid tenderness. Tympanic membrane is not perforated.  Nose: Rhinorrhea present. Right sinus exhibits no maxillary sinus tenderness and no frontal sinus tenderness. Left sinus exhibits no maxillary sinus tenderness and no frontal sinus tenderness.  Mouth/Throat: Uvula is midline, oropharynx is clear and moist and mucous membranes are normal.  Following nursing irrigation of patient's left ear patient states that pain has improved.  Patient's eardrum is now clear of cerumen, tympanic membrane intact and normal appearing.  Patient states that she now is hearing at baseline with her left ear.  Eyes: Pupils are equal, round, and reactive to light. Conjunctivae and EOM are normal.  Neck: Trachea normal, normal range of motion, full passive range of motion without pain and phonation normal. Neck supple. No tracheal deviation present.  Cardiovascular: Normal rate, regular rhythm and normal heart sounds.  Pulmonary/Chest: Effort normal and breath sounds normal. No respiratory distress. She has no decreased breath sounds. She has no wheezes.  Abdominal: Soft. There is no tenderness. There is no rigidity, no rebound and no guarding.  Musculoskeletal: Normal range of motion.  Neurological: She is alert. GCS eye subscore is 4. GCS verbal subscore is 5. GCS motor subscore is 6.  Speech is clear and goal oriented, follows commands Major Cranial nerves without deficit, no facial droop Moves extremities without ataxia, coordination intact Normal gait  Skin: Skin is warm and dry.  Psychiatric: She has a normal mood and affect. Her behavior is normal.     ED Treatments / Results  Labs (all labs ordered are listed, but only abnormal results are displayed) Labs Reviewed - No data to display  EKG None  Radiology No results found.  Procedures Procedures (including critical care time)  Medications Ordered in ED Medications  acetaminophen (TYLENOL) tablet 650 mg (has no administration in time range)     Initial Impression / Assessment and Plan / ED Course  I have reviewed the triage vital signs and the nursing notes.  Pertinent labs & imaging results that were available during my care of the patient were reviewed by me and considered in my medical decision making (see chart for details).     27 year old otherwise healthy female presenting today for left ear pain and decreased hearing that began this morning.  Examination today shows cerumen impaction of the left ear canal.  This was irrigated by nursing staff.  Post irrigation patient with normal-appearing ear canal and left tympanic membrane.  Patient states that hearing has improved and pain is improving as  well.  No signs of otitis media, otitis externa, mastoiditis or other infections of the head/neck.  Patient is afebrile, not tachycardic, not hypotensive, well-appearing and in no acute distress.  No indication for antibiotic at this time.  Discussed with patient that she should no longer use Q-tips to clean her ears and to use over-the-counter earwax solution as directed on the packaging.  Patient states understanding.  At this time there does not appear to be any evidence of an acute emergency medical condition and the patient appears stable for discharge with appropriate outpatient follow up. Diagnosis was discussed with patient who verbalizes understanding of care plan and is agreeable to discharge. I have discussed return precautions with patient and family at bedside who verbalize understanding of return precautions. Patient strongly encouraged to follow-up with their PCP. All  questions answered.  Note: Portions of this report may have been transcribed using voice recognition software. Every effort was made to ensure accuracy; however, inadvertent computerized transcription errors may still be present. Final Clinical Impressions(s) / ED Diagnoses   Final diagnoses:  Impacted cerumen of left ear    ED Discharge Orders    None       Elizabeth Palau 11/08/18 1901    Charlynne Pander, MD 11/08/18 2350

## 2018-11-08 NOTE — ED Notes (Signed)
Patient stated that there was blood on her left ear when she wiped it 2 hours PTA.

## 2018-11-08 NOTE — Discharge Instructions (Addendum)
You have been diagnosed today with Cerumen Impaction of the Left Ear.  At this time there does not appear to be the presence of an emergent medical condition, however there is always the potential for conditions to change. Please read and follow the below instructions.  Please return to the Emergency Department immediately for any new or worsening symptoms. Please be sure to follow up with your Primary Care Provider within one week regarding your visit today; please call their office to schedule an appointment even if you are feeling better for a follow-up visit. Please avoid future use of cotton swabs to clean your ears.  You may use over-the-counter earwax dissolving solutions such as Debrox to clean your ears.  Contact a health care provider if: You have ear pain. You develop a fever. You have blood, pus, or other fluid coming from your ear. You have hearing loss. You have ringing in your ears that does not go away. Your symptoms do not improve with treatment. You feel like the room is spinning (vertigo).  Please read the additional information packets attached to your discharge summary.  Do not take your medicine if  develop an itchy rash, swelling in your mouth or lips, or difficulty breathing.

## 2018-11-08 NOTE — ED Triage Notes (Signed)
PResents with left sided ear pain that began last night assosicated with hearing loss. Cerumen impaction noted. Pt is tearful.

## 2019-03-18 ENCOUNTER — Other Ambulatory Visit: Payer: Self-pay

## 2019-03-18 ENCOUNTER — Encounter (HOSPITAL_BASED_OUTPATIENT_CLINIC_OR_DEPARTMENT_OTHER): Payer: Self-pay | Admitting: Emergency Medicine

## 2019-03-18 ENCOUNTER — Emergency Department (HOSPITAL_BASED_OUTPATIENT_CLINIC_OR_DEPARTMENT_OTHER)
Admission: EM | Admit: 2019-03-18 | Discharge: 2019-03-18 | Disposition: A | Discharge status: Home / Self Care | Payer: Medicaid Other | Attending: Emergency Medicine | Admitting: Emergency Medicine

## 2019-03-18 DIAGNOSIS — K0889 Other specified disorders of teeth and supporting structures: Secondary | ICD-10-CM

## 2019-03-18 DIAGNOSIS — F17210 Nicotine dependence, cigarettes, uncomplicated: Secondary | ICD-10-CM | POA: Diagnosis not present

## 2019-03-18 DIAGNOSIS — F909 Attention-deficit hyperactivity disorder, unspecified type: Secondary | ICD-10-CM | POA: Insufficient documentation

## 2019-03-18 MED ORDER — PENICILLIN V POTASSIUM 500 MG PO TABS: 500.0000 mg | ORAL_TABLET | Freq: Four times a day (QID) | ORAL | 0 refills | Status: AC

## 2019-03-18 NOTE — ED Provider Notes (Signed)
[facility] EMERGENCY DEPARTMENT Provider Note   CSN: [id number] Arrival date & time: 03/18/19  2052    History   Chief Complaint Chief Complaint  Patient presents with  . Dental Pain    HPI Cheryl Green is a 28 y.o. female present day for left-sided dental pain.  Patient reports left upper and left lower dental pain x2 weeks.  She describes a moderate intensity constant ache/throb that is worsened with chewing on the left side and slightly improved following ibuprofen.  Patient last took ibuprofen yesterday with moderate relief of pain.  Patient is requesting referral to a dentist today.  On my evaluation patient denies any additional concerns.  She denies any recent history of fever/chills, cough/shortness of breath, chest pain, body aches, face swelling, neck swelling, difficulty swallowing or difficulty breathing.  Patient reports that she is an otherwise healthy 28 year old female with no chronic medical conditions or daily medication use.  Patient denies chance of pregnancy as she reports being abstinent for multiple months she has refused pregnancy test today.     HPI  Past Medical History:  Diagnosis Date  . ADHD (attention deficit hyperactivity disorder)   . Anxiety attack     There are no active problems to display for this patient.   History reviewed. No pertinent surgical history.   OB History   No obstetric history on file.      Home Medications    Prior to Admission medications   Medication Sig Start Date End Date Taking? Authorizing Provider  ibuprofen (ADVIL,MOTRIN) 400 MG tablet Take 1 tablet (400 mg total) by mouth every 6 (six) hours as needed. 06/05/18  Yes Ivery Quale, PA-C  diphenhydrAMINE (BENADRYL) 25 MG tablet Take 1 tablet (25 mg total) by mouth every 6 (six) hours. X 3 days, then as needed 06/21/13   Chad, Emily, PA-C  LORazepam (ATIVAN) 1 MG tablet Take 1 tablet (1 mg total) by mouth 3 (three) times daily as needed for anxiety.  10/28/12   Ivonne Andrew, PA-C  penicillin v potassium (VEETID) 500 MG tablet Take 1 tablet (500 mg total) by mouth 4 (four) times daily for 7 days. 03/18/19 03/25/19  Harlene Salts A, PA-C  predniSONE (STERAPRED UNI-PAK) 10 MG tablet Take 1 tablet (10 mg total) by mouth daily. Day 1: take 6 tabs.  Day 2: 5 tabs  Day 3: 4 tabs  Day 4: 3 tabs  Day 5: 2 tabs  Day 6: 1 tab  Start 06/22/13 06/21/13   Trixie Dredge, PA-C    Family History History reviewed. No pertinent family history.  Social History Social History   Tobacco Use  . Smoking status: Current Some Day Smoker    Types: Cigarettes  . Smokeless tobacco: Never Used  Substance Use Topics  . Alcohol use: Yes    Comment: ocassionally  . Drug use: No     Allergies   Patient has no known allergies.   Review of Systems Review of Systems  Constitutional: Negative for appetite change, chills, fatigue and fever.  HENT: Positive for dental problem. Negative for drooling, facial swelling, rhinorrhea, sinus pressure, sore throat, trouble swallowing and voice change.   Eyes: Negative.  Negative for pain.  Respiratory: Negative.  Negative for cough, choking and shortness of breath.   Cardiovascular: Negative.  Negative for chest pain.  Gastrointestinal: Negative.  Negative for abdominal pain, diarrhea, nausea and vomiting.  Musculoskeletal: Negative.  Negative for neck pain and neck stiffness.  Neurological: Negative.  Negative for  weakness and headaches.   Physical Exam Updated Vital Signs BP (!) 150/79   Pulse (!) 106   Temp 97.9 F (36.6 C)   Resp 16   Ht 5\' 7"  (1.702 m)   Wt 45.4 kg   LMP 03/13/2019   SpO2 96%   BMI 15.66 kg/m   Physical Exam Constitutional:      General: She is not in acute distress.    Appearance: Normal appearance. She is well-developed. She is not ill-appearing or diaphoretic.  HENT:     Head: Normocephalic and atraumatic. No raccoon eyes or Battle's sign.     Jaw: There is normal jaw occlusion. No  trismus.     Comments: No facial swelling.    Right Ear: Tympanic membrane, ear canal and external ear normal.     Left Ear: Tympanic membrane, ear canal and external ear normal.     Nose: Nose normal.     Mouth/Throat:     Lips: Pink.     Mouth: Mucous membranes are moist.     Pharynx: Oropharynx is clear. Uvula midline.      Comments: Patient with poor dentition overall multiple missing teeth and dental caries.  Two areas indicated on chart are patient's areas of pain.  The patient has normal phonation and is in control of secretions. No stridor.  Midline uvula without edema. Soft palate rises symmetrically. No tonsillar erythema, swelling or exudates. Tongue protrusion is normal, floor of mouth is soft. No trismus. No creptius on neck palpation. No gingival erythema or fluctuance noted. Mucus membranes moist. Eyes:     General: Vision grossly intact. Gaze aligned appropriately.     Extraocular Movements: Extraocular movements intact.     Conjunctiva/sclera: Conjunctivae normal.     Pupils: Pupils are equal, round, and reactive to light.     Comments: No pain with extraocular movements.  Neck:     Musculoskeletal: Full passive range of motion without pain, normal range of motion and neck supple. No edema, neck rigidity or crepitus.     Trachea: Trachea and phonation normal. No tracheal tenderness or tracheal deviation.  Cardiovascular:     Rate and Rhythm: Normal rate and regular rhythm.     Heart sounds: Normal heart sounds.  Pulmonary:     Effort: Pulmonary effort is normal. No accessory muscle usage or respiratory distress.     Breath sounds: Normal breath sounds and air entry.  Abdominal:     General: There is no distension.     Palpations: Abdomen is soft.     Tenderness: There is no abdominal tenderness. There is no guarding or rebound.  Musculoskeletal: Normal range of motion.  Skin:    General: Skin is warm and dry.  Neurological:     Mental Status: She is alert.     GCS:  GCS eye subscore is 4. GCS verbal subscore is 5. GCS motor subscore is 6.     Comments: Speech is clear and goal oriented, follows commands Major Cranial nerves without deficit, no facial droop Moves extremities without ataxia, coordination intact Normal gait  Psychiatric:        Behavior: Behavior normal.    ED Treatments / Results  Labs (all labs ordered are listed, but only abnormal results are displayed) Labs Reviewed - No data to display  EKG None  Radiology No results found.  Procedures Procedures (including critical care time)  Medications Ordered in ED Medications - No data to display   Initial Impression / Assessment and  Plan / ED Course  I have reviewed the triage vital signs and the nursing notes.  Pertinent labs & imaging results that were available during my care of the patient were reviewed by me and considered in my medical decision making (see chart for details).    28 year old otherwise healthy female presents today for 2 weeks of left-sided upper and lower dental pain.  Multiple infected dental surface cavity noted without signs or symptoms of dental abscess, no swelling/erythema/tenderness of the gums.  Patient is well-appearing, afebrile, nontoxic, speaking well.  Patient able to swallow without pain.  No signs of swelling or concern for Ludwig's angina/Peritonsilar abscess/Retropharyngeal abscess or other deep tissue infections.  No facial swelling or pain with extraocular motion, no signs of preseptal/orbital cellulitis.  There is no pain with palpation of the sinuses.  No sign of swelling of the neck, patient has good range of motion of the neck, no trismus. Patient has refused pregnancy test today. We will treat with penicillin VK, OTC Tylenol, heat/cool compressions.  Patient has been given dental resources.  At this time there does not appear to be any evidence of an acute emergency medical condition and the patient appears stable for discharge with  appropriate outpatient follow up. Diagnosis was discussed with patient who verbalizes understanding of care plan and is agreeable to discharge. I have discussed return precautions with patient who verbalizes understanding of return precautions. Patient encouraged to follow-up with their PCP. All questions answered.  Patient has been discharged in good condition.  Note: Portions of this report may have been transcribed using voice recognition software. Every effort was made to ensure accuracy; however, inadvertent computerized transcription errors may still be present. Final Clinical Impressions(s) / ED Diagnoses   Final diagnoses:  Pain, dental    ED Discharge Orders         Ordered    penicillin v potassium (VEETID) 500 MG tablet  4 times daily     03/18/19 2126           Elizabeth Palau 03/18/19 2133    Melene Plan, DO 03/18/19 2248

## 2019-03-18 NOTE — ED Triage Notes (Signed)
Dental pain x2weeks, states "its so bad it takes my breath away." On phone and drinking mountain dew in triage.

## 2019-03-18 NOTE — Discharge Instructions (Signed)
You have been diagnosed today with dental pain.  At this time there does not appear to be the presence of an emergent medical condition, however there is always the potential for conditions to change. Please read and follow the below instructions.  Please return to the Emergency Department immediately for any new or worsening symptoms. Please be sure to follow up with your Primary Care Provider within one week regarding your visit today; please call their office to schedule an appointment even if you are feeling better for a follow-up visit. Please use the resource guide below to locate a dentist in your area.  A dentist is the correct medical professional to permanently fix your teeth.  Please take the antibiotic penicillin as prescribed to help with your symptoms.  You may also use over-the-counter Tylenol as directed on the packaging to help with your symptoms.  Heating and cooling packs may also be helpful.  Please practice good oral hygiene and follow-up with your primary care provider and your dentist. Additionally you can contact Dr. Garvin Fila to schedule an appointment regarding your dental pain.  Call his office tomorrow.  Get help right away if: You cannot open your mouth. You are having trouble breathing or swallowing. You have a fever. Your face, neck, or jaw is swollen. Any new or concerning symptoms.  Please read the additional information packets attached to your discharge summary.  Do not take your medicine if  develop an itchy rash, swelling in your mouth or lips, or difficulty breathing. Get help right away if a reaction occurs by going to the emergency department and calling 911. ---------------- RESOURCE GUIDE  Dental Assistance  If unable to pay or uninsured, contact:  [facility] or [facility]. to become qualified for the adult dental clinic.  Patients with Medicaid: [facility] [phone] [address], [phone] [address], [phone]  If unable to pay, or uninsured, contact [facility] [phone]) or [facility] Department [phone] in [place], [phone] in [facility]) to become qualified for the adult dental clinic   Other Low-Cost Community Dental Services: [facility]- [address] [place], [place], [place], [address], [phone], Ext. [phone], 2nd and 4th Thursday of the month at 6:30am.  10 clients each day by appointment, can sometimes see walk-in patients if someone does not show for an appointment. [facility]- [address] [place], [place], [address], [phone] [facility] [address] [place], [place], [address], [phone] [facility]- [phone] [facility] Department- [phone] [facility] [phone]

## 2019-04-09 ENCOUNTER — Telehealth: Payer: Self-pay | Admitting: *Deleted

## 2019-04-09 NOTE — Telephone Encounter (Signed)
Cheryl Green,BSN,RN3,CCM,CN:  TCT-patient for well check, had 8 teeth extracted on [id number]  Is keeping ice off and on as instructed,  Bleeding minium now and under cont roll, doing oral salien mouth washsd and is aware of sign of infection and dry socket. Had no other problems or questions.  Has my phone numbler for any problems or questions.

## 2019-08-06 ENCOUNTER — Emergency Department (HOSPITAL_BASED_OUTPATIENT_CLINIC_OR_DEPARTMENT_OTHER)
Admission: EM | Admit: 2019-08-06 | Discharge: 2019-08-06 | Disposition: A | Discharge status: Home / Self Care | Payer: Medicaid Other | Attending: Emergency Medicine | Admitting: Emergency Medicine

## 2019-08-06 ENCOUNTER — Encounter (HOSPITAL_BASED_OUTPATIENT_CLINIC_OR_DEPARTMENT_OTHER): Payer: Self-pay | Admitting: *Deleted

## 2019-08-06 ENCOUNTER — Other Ambulatory Visit: Payer: Self-pay

## 2019-08-06 DIAGNOSIS — F17210 Nicotine dependence, cigarettes, uncomplicated: Secondary | ICD-10-CM | POA: Diagnosis not present

## 2019-08-06 DIAGNOSIS — R6889 Other general symptoms and signs: Secondary | ICD-10-CM | POA: Diagnosis not present

## 2019-08-06 DIAGNOSIS — R208 Other disturbances of skin sensation: Secondary | ICD-10-CM | POA: Diagnosis present

## 2019-08-06 DIAGNOSIS — R209 Unspecified disturbances of skin sensation: Secondary | ICD-10-CM

## 2019-08-06 DIAGNOSIS — R202 Paresthesia of skin: Secondary | ICD-10-CM | POA: Diagnosis not present

## 2019-08-06 NOTE — ED Triage Notes (Signed)
Pt reports 3 days of her fingers and toes feeling cold and tingling. Denies any other c/o. Moe + x 4 ext.

## 2019-08-06 NOTE — Discharge Instructions (Addendum)
You were evaluated in the Emergency Department and after careful evaluation, we did not find any emergent condition requiring admission or further testing in the hospital.  Your symptoms today seem to be due to Raynaud's syndrome.  We recommend primary care follow-up to further discuss these symptoms, especially if they recur.  Please return to the Emergency Department if you experience any worsening of your condition.  We encourage you to follow up with a primary care provider.  Thank you for allowing [place] to be a part of your care.

## 2019-08-06 NOTE — ED Provider Notes (Signed)
[facility] Emergency Department Provider Note MRN:  [id number]  Arrival date & time: 08/06/19     Chief Complaint   fingers and toes cold and tingling x 3 days   History of Present Illness   Cheryl Green is a 28 y.o. year-old female with no pertinent past medical history presenting to the ED with chief complaint of cold extremities.  Patient has been experiencing intermittent very cold hands and feet for the past 3 days.  She woke up this morning and began had an episode of severe cold to her bilateral feet and hands.  She used a blow dryer with hot air to warm up her feet, then put socks on, and this after several minutes improved her feet symptoms.  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  She noticed that her hands were so cold that she was unable to use them to text.  She denies numbness or weakness, does endorse some paresthesia to the bilateral hands.  Denies headache, no vision change, no neck pain, no chest pain or shortness of breath, no abdominal pain, no dysuria.  Review of Systems  A complete 10 system review of systems was obtained and all systems are negative except as noted in the HPI and PMH.   Patient's Health History    Past Medical History:  Diagnosis Date  . ADHD (attention deficit hyperactivity disorder)   . Anxiety attack     History reviewed. No pertinent surgical history.  History reviewed. No pertinent family history.  Social History   Socioeconomic History  . Marital status: Single    Spouse name: Not on file  . Number of children: Not on file  . Years of education: Not on file  . Highest education level: Not on file  Occupational History  . Not on file  Social Needs  . Financial resource strain: Not on file  . Food insecurity    Worry: Not on file    Inability: Not on file  . Transportation needs    Medical: Not on file    Non-medical: Not on file  Tobacco Use  . Smoking status:  Current Some Day Smoker    Types: Cigarettes  . Smokeless tobacco: Never Used  Substance and Sexual Activity  . Alcohol use: Yes    Comment: ocassionally  . Drug use: No  . Sexual activity: Not on file  Lifestyle  . Physical activity    Days per week: Not on file    Minutes per session: Not on file  . Stress: Not on file  Relationships  . Social Herbalist on phone: Not on file    Gets together: Not on file    Attends religious service: Not on file    Active member of club or organization: Not on file    Attends meetings of clubs or organizations: Not on file    Relationship status: Not on file  . Intimate partner violence    Fear of current or ex partner: Not on file    Emotionally abused: Not on file    Physically abused: Not on file    Forced sexual activity: Not on file  Other Topics Concern  . Not on file  Social History Narrative  . Not on file     Physical Exam  Vital Signs and Nursing Notes reviewed Vitals:   08/06/19 1149  BP: 123/82  Pulse: (!) 57  Resp: 18  Temp: 98.3 F (36.8 C)  SpO2: 100%    CONSTITUTIONAL: Well-appearing, NAD NEURO:  Alert and oriented x 3, no focal deficits EYES:  eyes equal and reactive ENT/NECK:  no LAD, no JVD CARDIO: Regular rate, well-perfused, normal S1 and S2 PULM:  CTAB no wheezing or rhonchi GI/GU:  normal bowel sounds, non-distended, non-tender MSK/SPINE:  No gross deformities, no edema SKIN:  no rash, atraumatic; slightly pale appearance of the bilateral hands, normal cap refill, neurovascularly intact; normal appearance of the bilateral feet, warm to touch, normal cap refill, neurovascularly intact PSYCH:  Appropriate speech and behavior  Diagnostic and Interventional Summary    Labs Reviewed - No data to display  No orders to display    Medications - No data to display   Procedures Critical Care  ED Course and Medical Decision Making  I have reviewed the triage vital signs and the nursing notes.   Pertinent labs & imaging results that were available during my care of the patient were reviewed by me and considered in my medical decision making (see below for details).  Suspect Raynaud's syndrome in this 28 year old female, otherwise healthy.  Feet symptoms seem to have been resolved after warming treatment.  We provided heating packs to her hands, and after several minutes her hands changed colors from a white to a pink or red.  Patient denies ever seeing any blue discoloration to her hands or feet.  Nothing to suggest gangrenous change today.  She denies any other symptoms to suggest an underlying autoimmune process.  She is appropriate for PCP follow-up.  Elmer SowMichael M. Pilar PlateBero, MD [facility] [email or web address]  Final Clinical Impressions(s) / ED Diagnoses     ICD-10-CM   1. Cold extremities  R68.89     ED Discharge Orders    None        Discharge Instructions     You were evaluated in the Emergency Department and after careful evaluation, we did not find any emergent condition requiring admission or further testing in the hospital.  Your symptoms today seem to be due to Raynaud's syndrome.  We recommend primary care follow-up to further discuss these symptoms, especially if they recur.  Please return to the Emergency Department if you experience any worsening of your condition.  We encourage you to follow up with a primary care provider.  Thank you for allowing us to be a part of your care.       Sabas SousBero, Liviya Santini M, MD 08/06/19 1415

## 2019-12-07 ENCOUNTER — Other Ambulatory Visit: Payer: Self-pay

## 2019-12-07 ENCOUNTER — Encounter (HOSPITAL_BASED_OUTPATIENT_CLINIC_OR_DEPARTMENT_OTHER): Payer: Self-pay | Admitting: Emergency Medicine

## 2019-12-07 ENCOUNTER — Emergency Department (HOSPITAL_BASED_OUTPATIENT_CLINIC_OR_DEPARTMENT_OTHER)
Admission: EM | Admit: 2019-12-07 | Discharge: 2019-12-07 | Disposition: A | Discharge status: Home / Self Care | Payer: Medicaid Other | Attending: Emergency Medicine | Admitting: Emergency Medicine

## 2019-12-07 DIAGNOSIS — K0889 Other specified disorders of teeth and supporting structures: Secondary | ICD-10-CM | POA: Insufficient documentation

## 2019-12-07 DIAGNOSIS — L0201 Cutaneous abscess of face: Secondary | ICD-10-CM | POA: Insufficient documentation

## 2019-12-07 DIAGNOSIS — L0291 Cutaneous abscess, unspecified: Secondary | ICD-10-CM

## 2019-12-07 DIAGNOSIS — R220 Localized swelling, mass and lump, head: Secondary | ICD-10-CM

## 2019-12-07 DIAGNOSIS — Z87891 Personal history of nicotine dependence: Secondary | ICD-10-CM | POA: Insufficient documentation

## 2019-12-07 MED ORDER — HYDROCODONE-ACETAMINOPHEN 5-325 MG PO TABS
1.0000 | ORAL_TABLET | Freq: Once | ORAL | Status: AC
  Administered 2019-12-07: 1 via ORAL
  Filled 2019-12-07: qty 1

## 2019-12-07 MED ORDER — HYDROCODONE-ACETAMINOPHEN 5-325 MG PO TABS: 1.0000 | ORAL_TABLET | Freq: Four times a day (QID) | ORAL | 0 refills | Status: DC | PRN

## 2019-12-07 MED ORDER — CLINDAMYCIN HCL 150 MG PO CAPS: 300.0000 mg | ORAL_CAPSULE | Freq: Three times a day (TID) | ORAL | 0 refills | Status: AC

## 2019-12-07 NOTE — ED Provider Notes (Signed)
[facility] EMERGENCY DEPARTMENT Provider Note   CSN: [id number] Arrival date & time: 12/07/19  1497     History Chief Complaint  Patient presents with  . Facial Swelling    Marni B Koch is a 28 y.o. female presenting for evaluation of facial swelling and pain.  Patient states yesterday she noticed a small bump on her chin.  There was no pustule or purulent head.  She did try and squeeze and pop it however.  She slept with a warm compress on last night, and when she woke up this morning area was much more swollen and tender.  Patient states since this morning it has been draining puslike fluid.  She states her whole jaw feels swollen, making it difficult to open her mouth.  She denies fevers, chills, nausea, vomiting.  She denies dental issue.  She did set up appointment with a dentist next week however.  She has taken ibuprofen without improvement of her symptoms.  She has not tried anything else.   HPI     Past Medical History:  Diagnosis Date  . ADHD (attention deficit hyperactivity disorder)   . Anxiety attack     There are no problems to display for this patient.   History reviewed. No pertinent surgical history.   OB History   No obstetric history on file.     No family history on file.  Social History   Tobacco Use  . Smoking status: Former Smoker    Types: Cigarettes  . Smokeless tobacco: Never Used  Substance Use Topics  . Alcohol use: Yes    Comment: ocassionally  . Drug use: No    Home Medications Prior to Admission medications   Medication Sig Start Date End Date Taking? Authorizing Provider  clindamycin (CLEOCIN) 150 MG capsule Take 2 capsules (300 mg total) by mouth 3 (three) times daily for 7 days. 12/07/19 12/14/19  Coltyn Hanning, PA-C  diphenhydrAMINE (BENADRYL) 25 MG tablet Take 1 tablet (25 mg total) by mouth every 6 (six) hours. X 3 days, then as needed 06/21/13   [place], Emily, PA-C  HYDROcodone-acetaminophen (NORCO/VICODIN)  5-325 MG tablet Take 1 tablet by mouth every 6 (six) hours as needed for severe pain. 12/07/19   Hendricks Schwandt, PA-C  ibuprofen (ADVIL,MOTRIN) 400 MG tablet Take 1 tablet (400 mg total) by mouth every 6 (six) hours as needed. 06/05/18   Lily Kocher, PA-C  LORazepam (ATIVAN) 1 MG tablet Take 1 tablet (1 mg total) by mouth 3 (three) times daily as needed for anxiety. 10/28/12   Dammen, Collier Salina, PA-C  predniSONE (STERAPRED UNI-PAK) 10 MG tablet Take 1 tablet (10 mg total) by mouth daily. Day 1: take 6 tabs.  Day 2: 5 tabs  Day 3: 4 tabs  Day 4: 3 tabs  Day 5: 2 tabs  Day 6: 1 tab  Start 06/22/13 06/21/13   Clayton Bibles, PA-C    Allergies    Patient has no known allergies.  Review of Systems   Review of Systems  Constitutional: Negative for fever.  HENT: Positive for facial swelling.     Physical Exam Updated Vital Signs BP 133/67 (BP Location: Left Arm)   Pulse 77   Temp 98.7 F (37.1 C) (Oral)   Resp 14   Ht 5\' 8"  (1.727 m)   Wt 54.4 kg   LMP 12/07/2019   SpO2 100%   BMI 18.25 kg/m   Physical Exam Vitals and nursing note reviewed.  Constitutional:  General: She is not in acute distress.    Appearance: She is well-developed.  HENT:     Head: Normocephalic and atraumatic.      Comments: See pictures below.  Swelling and tenderness of the chin. No active drainage    Mouth/Throat:      Comments: Tenderness palpation of left lower teeth out gum erythema or edema.  No obvious abscess.  No pain under the tongue.  Mild, 3 finger trismus.  No difficulty handling secretions.  No swelling extending into the neck. Pulmonary:     Effort: Pulmonary effort is normal.  Abdominal:     General: There is no distension.  Musculoskeletal:        General: Normal range of motion.     Cervical back: Normal range of motion.  Skin:    General: Skin is warm.     Findings: No rash.  Neurological:     Mental Status: She is alert and oriented to person, place, and time.           ED  Results / Procedures / Treatments   Labs (all labs ordered are listed, but only abnormal results are displayed) Labs Reviewed - No data to display  EKG None  Radiology No results found.  Procedures Procedures (including critical care time)  Medications Ordered in ED Medications  HYDROcodone-acetaminophen (NORCO/VICODIN) 5-325 MG per tablet 1 tablet (has no administration in time range)    ED Course  I have reviewed the triage vital signs and the nursing notes.  Pertinent labs & imaging results that were available during my care of the patient were reviewed by me and considered in my medical decision making (see chart for details).    MDM Rules/Calculators/A&P                      Patient presenting for evaluation of facial swelling and pain.  Physical exam shows swelling of the chin.  Patient also with dental tenderness and mild trismus.  Consider dental infection versus facial abscess.  No pain in the tongue swelling extending to the neck, doubt Ludwig's.  I do not believe she needs imaging at this time.  Discussed ideal treatment of I&D followed by antibiotics and close observation.  Patient does not want an I&D performed today.  Discussed that her symptoms may worsen, and she will need to return immediately if this happens.  Discussed importance of taking antibiotics and close follow-up with dentistry.  At this time, patient appears safe for discharge.  Return precautions given.  Patient states she understands and agrees to plan.  Final Clinical Impression(s) / ED Diagnoses Final diagnoses:  Facial swelling  Abscess    Rx / DC Orders ED Discharge Orders         Ordered    HYDROcodone-acetaminophen (NORCO/VICODIN) 5-325 MG tablet  Every 6 hours PRN     12/07/19 1052    clindamycin (CLEOCIN) 150 MG capsule  3 times daily     12/07/19 [address] Sudeep Scheibel, PA-C 12/07/19 1137    Alvira Monday, MD 12/08/19 [phone]

## 2019-12-07 NOTE — ED Triage Notes (Signed)
Swelling and possible abscess on chin since yesterday.  Pt sts before yesterday there was a "bump" and she put a warm compress on it and today it is much worse.Denies any dental involvement.

## 2019-12-07 NOTE — Discharge Instructions (Signed)
Take antibiotics as prescribed.  Take entire course, even if your symptoms improve. Take ibuprofen 3 times a day with meals.  Do not take other anti-inflammatories at the same time (Advil, Motrin, naproxen, Aleve). You may supplement with Tylenol if you need further pain control. Use Norco as needed for severe breakthrough pain.  Have caution, this is a narcotic medicine.  Do not drive or operate heavy machinery while taking this medicine. Use a warm compress, 20 minutes at a time, 3 times a day. Follow-up with the dentist at your scheduled appointment. Return to the emergency room for develop high fevers, severe worsening pain, neck stiffness, inability to open your mouth, difficulty swallowing, or any new, worsening, or concerning symptoms.

## 2020-04-19 ENCOUNTER — Encounter (HOSPITAL_BASED_OUTPATIENT_CLINIC_OR_DEPARTMENT_OTHER): Payer: Self-pay | Admitting: Emergency Medicine

## 2020-04-19 ENCOUNTER — Emergency Department (HOSPITAL_BASED_OUTPATIENT_CLINIC_OR_DEPARTMENT_OTHER)
Admission: EM | Admit: 2020-04-19 | Discharge: 2020-04-19 | Disposition: A | Discharge status: Home / Self Care | Payer: Medicaid Other | Attending: Emergency Medicine | Admitting: Emergency Medicine

## 2020-04-19 ENCOUNTER — Other Ambulatory Visit: Payer: Self-pay

## 2020-04-19 DIAGNOSIS — L03116 Cellulitis of left lower limb: Secondary | ICD-10-CM | POA: Insufficient documentation

## 2020-04-19 DIAGNOSIS — Z87891 Personal history of nicotine dependence: Secondary | ICD-10-CM | POA: Insufficient documentation

## 2020-04-19 DIAGNOSIS — M79652 Pain in left thigh: Secondary | ICD-10-CM | POA: Diagnosis present

## 2020-04-19 HISTORY — DX: Scoliosis, unspecified: M41.9

## 2020-04-19 MED ORDER — HYDROCODONE-ACETAMINOPHEN 5-325 MG PO TABS: 1.0000 | ORAL_TABLET | Freq: Four times a day (QID) | ORAL | 0 refills | Status: DC | PRN

## 2020-04-19 MED ORDER — CLINDAMYCIN HCL 150 MG PO CAPS: 150.0000 mg | ORAL_CAPSULE | Freq: Four times a day (QID) | ORAL | 0 refills | Status: DC

## 2020-04-19 MED FILL — HYDROCODON-APAP 5-325: 5-325 | 2 days supply | Qty: 8 | Fill #0

## 2020-04-19 MED FILL — CLINDAMYCIN HCL 150 MG CAPS: 150 | 7 days supply | Qty: 28 | Fill #0

## 2020-04-19 NOTE — ED Provider Notes (Signed)
[facility] EMERGENCY DEPARTMENT Provider Note   CSN: [id number] Arrival date & time: 04/19/20  0815     History Chief Complaint  Patient presents with  . Insect Bite    Cheryl Green is a 29 y.o. female.  She is complaining of a possible spider bite to her left thigh.  She said it has been there for few days and getting worse.  It causes pain into her thigh and has difficulty walking secondary to the pain.  Think she had a fever yesterday.  History of recurrent abscesses.  Does not know if she is MRSA positive but was last treated with clindamycin for the last time this happened.  Also has a few superficial sores on her face.  Denies any IV drug use  The history is provided by the patient.  Rash Location:  Leg Leg rash location:  L upper leg Quality: painful, redness and weeping   Pain details:    Quality:  Aching   Severity:  Moderate   Onset quality:  Gradual   Timing:  Constant   Progression:  Worsening Chronicity:  Recurrent Context: insect bite/sting (?)   Relieved by:  Nothing Worsened by:  Nothing Ineffective treatments: peroxide. Associated symptoms: fever   Associated symptoms: no nausea and not vomiting        Past Medical History:  Diagnosis Date  . ADHD (attention deficit hyperactivity disorder)   . Anxiety attack   . Scoliosis     There are no problems to display for this patient.   History reviewed. No pertinent surgical history.   OB History   No obstetric history on file.     No family history on file.  Social History   Tobacco Use  . Smoking status: Former Smoker    Types: Cigarettes  . Smokeless tobacco: Never Used  Substance Use Topics  . Alcohol use: Yes    Comment: ocassionally  . Drug use: No    Home Medications Prior to Admission medications   Medication Sig Start Date End Date Taking? Authorizing Provider  diphenhydrAMINE (BENADRYL) 25 MG tablet Take 1 tablet (25 mg total) by mouth every 6 (six) hours. X 3  days, then as needed 06/21/13   Chad, Emily, PA-C  HYDROcodone-acetaminophen (NORCO/VICODIN) 5-325 MG tablet Take 1 tablet by mouth every 6 (six) hours as needed for severe pain. 12/07/19   Caccavale, Sophia, PA-C  ibuprofen (ADVIL,MOTRIN) 400 MG tablet Take 1 tablet (400 mg total) by mouth every 6 (six) hours as needed. 06/05/18   Ivery Quale, PA-C  LORazepam (ATIVAN) 1 MG tablet Take 1 tablet (1 mg total) by mouth 3 (three) times daily as needed for anxiety. 10/28/12   Dammen, Theron Arista, PA-C  predniSONE (STERAPRED UNI-PAK) 10 MG tablet Take 1 tablet (10 mg total) by mouth daily. Day 1: take 6 tabs.  Day 2: 5 tabs  Day 3: 4 tabs  Day 4: 3 tabs  Day 5: 2 tabs  Day 6: 1 tab  Start 06/22/13 06/21/13   Trixie Dredge, PA-C    Allergies    Amoxicillin  Review of Systems   Review of Systems  Constitutional: Positive for fever.  Gastrointestinal: Negative for nausea and vomiting.  Skin: Positive for rash and wound.    Physical Exam Updated Vital Signs BP 120/72 (BP Location: Right Arm)   Pulse 73   Temp 98.2 F (36.8 C) (Oral)   Resp 16   Ht 5\' 8"  (1.727 m)   Wt 65.6 kg  LMP 03/29/2020   SpO2 100%   BMI 21.99 kg/m   Physical Exam Constitutional:      Appearance: She is well-developed.  HENT:     Head: Normocephalic and atraumatic.  Eyes:     Conjunctiva/sclera: Conjunctivae normal.  Cardiovascular:     Rate and Rhythm: Normal rate and regular rhythm.     Pulses: Normal pulses.  Pulmonary:     Effort: Pulmonary effort is normal.     Breath sounds: Normal breath sounds.  Musculoskeletal:        General: No deformity. Normal range of motion.     Cervical back: Neck supple.  Skin:    General: Skin is warm and dry.     Capillary Refill: Capillary refill takes less than 2 seconds.     Findings: Lesion and rash present.     Comments: Approximately 3 cm shallow ulceration on her anterior left thigh with some surrounding erythema and induration.  No fluctuance appreciated, no proximal  streaking. Has some other superficial wounds on her face.  Neurological:     General: No focal deficit present.     Mental Status: She is alert.     GCS: GCS eye subscore is 4. GCS verbal subscore is 5. GCS motor subscore is 6.     ED Results / Procedures / Treatments   Labs (all labs ordered are listed, but only abnormal results are displayed) Labs Reviewed - No data to display  EKG None  Radiology No results found.  Procedures Procedures (including critical care time)  Medications Ordered in ED Medications - No data to display  ED Course  I have reviewed the triage vital signs and the nursing notes.  Pertinent labs & imaging results that were available during my care of the patient were reviewed by me and considered in my medical decision making (see chart for details).   Differential diagnosis includes cellulitis, abscess, MRSA, skin picking MDM Rules/Calculators/A&P                     Patient here with painful skin lesion on left thigh.  Superficial ulceration with some induration and surrounding cellulitis.  No fluctuance that I think needs I&D.  History of abscesses and has multiple skin lesions.  Denies IV drug use.  I do not see any record of MRSA but she said she has had clindamycin in the past with improvement.  Will cover with clindamycin.  She is also asking for a few days of some pain medicine.  Reviewed in national database.  Final Clinical Impression(s) / ED Diagnoses Final diagnoses:  Cellulitis of left lower extremity    Rx / DC Orders ED Discharge Orders         Ordered    HYDROcodone-acetaminophen (NORCO/VICODIN) 5-325 MG tablet  Every 6 hours PRN     04/19/20 0910    clindamycin (CLEOCIN) 150 MG capsule  Every 6 hours     04/19/20 0910           Hayden Rasmussen, MD 04/19/20 [id number]

## 2020-04-19 NOTE — Discharge Instructions (Addendum)
You were seen in the emergency department for evaluation of an infection on your left thigh.  There was signs of an ulcer with some surrounding redness.  We are treating you with antibiotics and pain medication.  Please use mild soap and water.  Return to the emergency department for any worsening or concerning symptoms

## 2020-04-19 NOTE — ED Triage Notes (Signed)
Insect bite 3 days ago to left upper leg.  Did not see the insect.  Area getting worse each day.

## 2020-08-31 ENCOUNTER — Emergency Department (HOSPITAL_COMMUNITY)
Admission: EM | Admit: 2020-08-31 | Discharge: 2020-08-31 | Discharge status: Against Medical Advice | Payer: Medicaid Other

## 2020-08-31 NOTE — ED Notes (Signed)
Called x1. No answer.

## 2020-12-15 ENCOUNTER — Other Ambulatory Visit: Payer: Self-pay

## 2020-12-15 ENCOUNTER — Emergency Department (HOSPITAL_COMMUNITY)
Admission: EM | Admit: 2020-12-15 | Discharge: 2020-12-16 | Disposition: A | Discharge status: Home / Self Care | Payer: Medicaid Other | Attending: Emergency Medicine | Admitting: Emergency Medicine

## 2020-12-15 ENCOUNTER — Encounter (HOSPITAL_COMMUNITY): Payer: Self-pay | Admitting: Emergency Medicine

## 2020-12-15 DIAGNOSIS — F410 Panic disorder [episodic paroxysmal anxiety] without agoraphobia: Secondary | ICD-10-CM | POA: Insufficient documentation

## 2020-12-15 DIAGNOSIS — R064 Hyperventilation: Secondary | ICD-10-CM | POA: Diagnosis not present

## 2020-12-15 DIAGNOSIS — Z87891 Personal history of nicotine dependence: Secondary | ICD-10-CM | POA: Diagnosis not present

## 2020-12-15 DIAGNOSIS — R000 Tachycardia, unspecified: Secondary | ICD-10-CM | POA: Insufficient documentation

## 2020-12-15 DIAGNOSIS — R251 Tremor, unspecified: Secondary | ICD-10-CM | POA: Diagnosis not present

## 2020-12-15 DIAGNOSIS — E872 Acidosis: Secondary | ICD-10-CM | POA: Insufficient documentation

## 2020-12-15 DIAGNOSIS — E876 Hypokalemia: Secondary | ICD-10-CM | POA: Diagnosis not present

## 2020-12-15 DIAGNOSIS — F1910 Other psychoactive substance abuse, uncomplicated: Secondary | ICD-10-CM | POA: Insufficient documentation

## 2020-12-15 LAB — URINALYSIS, ROUTINE W REFLEX MICROSCOPIC
Bilirubin Urine: NEGATIVE
Glucose, UA: NEGATIVE mg/dL
Hgb urine dipstick: NEGATIVE
Ketones, ur: 80 mg/dL — AB
Nitrite: NEGATIVE
Protein, ur: 100 mg/dL — AB
Specific Gravity, Urine: 1.026 (ref 1.005–1.030)
pH: 5 (ref 5.0–8.0)

## 2020-12-15 LAB — COMPREHENSIVE METABOLIC PANEL
ALT: 12 U/L (ref 0–44)
AST: 26 U/L (ref 15–41)
Albumin: 4.6 g/dL (ref 3.5–5.0)
Alkaline Phosphatase: 51 U/L (ref 38–126)
Anion gap: 22 — ABNORMAL HIGH (ref 5–15)
BUN: 14 mg/dL (ref 6–20)
CO2: 14 mmol/L — ABNORMAL LOW (ref 22–32)
Calcium: 9.3 mg/dL (ref 8.9–10.3)
Chloride: 99 mmol/L (ref 98–111)
Creatinine, Ser: 1.08 mg/dL — ABNORMAL HIGH (ref 0.44–1.00)
GFR, Estimated: >60 mL/min (ref >60)
Glucose, Bld: 129 mg/dL — ABNORMAL HIGH (ref 70–99)
Potassium: 2.9 mmol/L — ABNORMAL LOW (ref 3.5–5.1)
Sodium: 135 mmol/L (ref 135–145)
Total Bilirubin: 1 mg/dL (ref 0.3–1.2)
Total Protein: 8 g/dL (ref 6.5–8.1)

## 2020-12-15 LAB — CBC WITH DIFFERENTIAL/PLATELET
Abs Immature Granulocytes: 0.03 10*3/uL (ref 0.00–0.07)
Basophils Absolute: 0 10*3/uL (ref 0.0–0.1)
Basophils Relative: 1 %
Eosinophils Absolute: 0 10*3/uL (ref 0.0–0.5)
Eosinophils Relative: 0 %
HCT: 37 % (ref 36.0–46.0)
Hemoglobin: 13.2 g/dL (ref 12.0–15.0)
Immature Granulocytes: 0 %
Lymphocytes Relative: 14 %
Lymphs Abs: 1.2 10*3/uL (ref 0.7–4.0)
MCH: 29.1 pg (ref 26.0–34.0)
MCHC: 35.7 g/dL (ref 30.0–36.0)
MCV: 81.5 fL (ref 80.0–100.0)
Monocytes Absolute: 0.7 10*3/uL (ref 0.1–1.0)
Monocytes Relative: 9 %
Neutro Abs: 6.3 10*3/uL (ref 1.7–7.7)
Neutrophils Relative %: 76 %
Platelets: 364 10*3/uL (ref 150–400)
RBC: 4.54 MIL/uL (ref 3.87–5.11)
RDW: 12.9 % (ref 11.5–15.5)
WBC: 8.3 10*3/uL (ref 4.0–10.5)
nRBC: 0 % (ref 0.0–0.2)

## 2020-12-15 LAB — RAPID URINE DRUG SCREEN, HOSP PERFORMED
Amphetamines: NOT DETECTED
Barbiturates: NOT DETECTED
Benzodiazepines: NOT DETECTED
Cocaine: NOT DETECTED
Opiates: NOT DETECTED
Tetrahydrocannabinol: POSITIVE — AB

## 2020-12-15 LAB — BLOOD GAS, VENOUS
Acid-Base Excess: 3 mmol/L — ABNORMAL HIGH (ref 0.0–2.0)
Bicarbonate: 26.8 mmol/L (ref 20.0–28.0)
O2 Saturation: 86.7 %
Patient temperature: 98.6
pCO2, Ven: 39.7 mmHg — ABNORMAL LOW (ref 44.0–60.0)
pH, Ven: 7.444 — ABNORMAL HIGH (ref 7.250–7.430)
pO2, Ven: 52.1 mmHg — ABNORMAL HIGH (ref 32.0–45.0)

## 2020-12-15 LAB — BASIC METABOLIC PANEL
Anion gap: 21 — ABNORMAL HIGH (ref 5–15)
BUN: 15 mg/dL (ref 6–20)
CO2: 14 mmol/L — ABNORMAL LOW (ref 22–32)
Calcium: 9.4 mg/dL (ref 8.9–10.3)
Chloride: 100 mmol/L (ref 98–111)
Creatinine, Ser: 0.99 mg/dL (ref 0.44–1.00)
GFR, Estimated: >60 mL/min (ref >60)
Glucose, Bld: 123 mg/dL — ABNORMAL HIGH (ref 70–99)
Potassium: 2.8 mmol/L — ABNORMAL LOW (ref 3.5–5.1)
Sodium: 135 mmol/L (ref 135–145)

## 2020-12-15 LAB — LACTIC ACID, PLASMA: Lactic Acid, Venous: 1.4 mmol/L (ref 0.5–1.9)

## 2020-12-15 LAB — ACETAMINOPHEN LEVEL: Acetaminophen (Tylenol), Serum: <10 ug/mL — ABNORMAL LOW (ref 10–30)

## 2020-12-15 LAB — ETHANOL: Alcohol, Ethyl (B): <10 mg/dL (ref <10)

## 2020-12-15 LAB — I-STAT BETA HCG BLOOD, ED (MC, WL, AP ONLY): I-stat hCG, quantitative: <5 m[IU]/mL (ref <5)

## 2020-12-15 LAB — CK: Total CK: 133 U/L (ref 38–234)

## 2020-12-15 LAB — TSH: TSH: 2.277 u[IU]/mL (ref 0.350–4.500)

## 2020-12-15 LAB — PHOSPHORUS: Phosphorus: 1.7 mg/dL — ABNORMAL LOW (ref 2.5–4.6)

## 2020-12-15 LAB — MAGNESIUM: Magnesium: 1.9 mg/dL (ref 1.7–2.4)

## 2020-12-15 MED ORDER — POTASSIUM CHLORIDE CRYS ER 20 MEQ PO TBCR
40.0000 meq | EXTENDED_RELEASE_TABLET | Freq: Once | ORAL | Status: AC
  Administered 2020-12-15: 16:00:00 40 meq via ORAL
  Filled 2020-12-15: qty 2

## 2020-12-15 MED ORDER — LACTATED RINGERS IV BOLUS
2000.0000 mL | Freq: Once | INTRAVENOUS | Status: AC
  Administered 2020-12-15: 15:00:00 2000 mL via INTRAVENOUS

## 2020-12-15 MED ORDER — LACTATED RINGERS IV BOLUS
1000.0000 mL | Freq: Once | INTRAVENOUS | Status: AC
  Administered 2020-12-15: 23:00:00 1000 mL via INTRAVENOUS

## 2020-12-15 MED ORDER — POTASSIUM CHLORIDE ER 20 MEQ PO TBCR: 20.0000 meq | EXTENDED_RELEASE_TABLET | Freq: Every day | ORAL | 0 refills | Status: AC

## 2020-12-15 MED ORDER — POTASSIUM CHLORIDE 10 MEQ/100ML IV SOLN
10.0000 meq | INTRAVENOUS | Status: DC
  Administered 2020-12-15: 23:00:00 10 meq via INTRAVENOUS
  Filled 2020-12-15: qty 100

## 2020-12-15 MED ORDER — MAGNESIUM SULFATE IN D5W 1-5 GM/100ML-% IV SOLN
1.0000 g | Freq: Once | INTRAVENOUS | Status: AC
  Administered 2020-12-15: 23:00:00 1 g via INTRAVENOUS
  Filled 2020-12-15: qty 100

## 2020-12-15 MED ORDER — LORAZEPAM 2 MG/ML IJ SOLN
INTRAMUSCULAR | Status: AC
  Filled 2020-12-15: qty 1

## 2020-12-15 MED ORDER — HALOPERIDOL LACTATE 5 MG/ML IJ SOLN
2.0000 mg | Freq: Once | INTRAMUSCULAR | Status: AC
  Administered 2020-12-15: 12:00:00 2 mg via INTRAVENOUS
  Filled 2020-12-15: qty 1

## 2020-12-15 MED ORDER — POTASSIUM CHLORIDE 10 MEQ/100ML IV SOLN
10.0000 meq | Freq: Once | INTRAVENOUS | Status: AC
  Administered 2020-12-15: 15:00:00 10 meq via INTRAVENOUS
  Filled 2020-12-15: qty 100

## 2020-12-15 MED ORDER — POTASSIUM CHLORIDE CRYS ER 20 MEQ PO TBCR
40.0000 meq | EXTENDED_RELEASE_TABLET | Freq: Once | ORAL | Status: AC
  Administered 2020-12-15: 23:00:00 40 meq via ORAL
  Filled 2020-12-15: qty 2

## 2020-12-15 MED ORDER — LACTATED RINGERS IV BOLUS
1000.0000 mL | Freq: Once | INTRAVENOUS | Status: AC
  Administered 2020-12-15: 12:00:00 1000 mL via INTRAVENOUS

## 2020-12-15 MED ORDER — POTASSIUM CHLORIDE ER 20 MEQ PO TBCR: 20.0000 meq | EXTENDED_RELEASE_TABLET | Freq: Every day | ORAL | 0 refills | Status: DC

## 2020-12-15 NOTE — Discharge Instructions (Signed)
Please follow-up with primary doctor.  Your blood work including your electrolytes and kidney function should be rechecked sometime within the next week.  Recommend eating extra daily banana.

## 2020-12-15 NOTE — ED Notes (Signed)
Patient removes equipment needed to monitor vitals.

## 2020-12-15 NOTE — ED Provider Notes (Signed)
[facility]   CSN: [id number] Arrival date & time: 12/15/20  1059     History Chief Complaint  Patient presents with  . Anxiety    Cheryl Green is a 29 y.o. female.  HPI Patient is being brought in from jail.  She is getting treated for detox from narcotics.  She has been there for approximately 7 days.  Today patient started getting extremely anxious and having "a panic attack".  She then started shaking and her blood pressure was high.  Patient was brought in for evaluation.  At my first evaluation, the patient was lying on the floor and exhibiting hyperventilating and tremoring that did not have seizure appearance to it.  Ultimately once this resolved and I am able to discuss this with the patient she reports she is not sure what happened she just started to feel really anxious and weak and then the last thing she remembers after that was waking up with everybody surrounding her.  Reports she does think she is gotten dehydrated because of the withdrawal.    Past Medical History:  Diagnosis Date  . ADHD (attention deficit hyperactivity disorder)   . Anxiety attack   . Scoliosis     There are no problems to display for this patient.   History reviewed. No pertinent surgical history.   OB History   No obstetric history on file.     History reviewed. No pertinent family history.  Social History   Tobacco Use  . Smoking status: Former Smoker    Types: Cigarettes  . Smokeless tobacco: Never Used  Substance Use Topics  . Alcohol use: Yes    Comment: ocassionally  . Drug use: No    Home Medications Prior to Admission medications   Medication Sig Start Date End Date Taking? Authorizing Provider  acetaminophen (TYLENOL) 325 MG tablet Take 650 mg by mouth every 6 (six) hours as needed for mild pain, fever or headache.   Yes [provider]  escitalopram (LEXAPRO) 10 MG tablet Take 30 mg by mouth daily.   Yes  [provider]  hydrOXYzine (ATARAX/VISTARIL) 50 MG tablet Take 50 mg by mouth in the morning and at bedtime.   Yes [provider]  loperamide (IMODIUM A-D) 2 MG tablet Take 4 mg by mouth 4 (four) times daily as needed for diarrhea or loose stools.   Yes [provider]  meclizine (ANTIVERT) 25 MG tablet Take 25 mg by mouth 3 (three) times daily as needed for dizziness.   Yes [provider]  sulfamethoxazole-trimethoprim (BACTRIM DS) 800-160 MG tablet Take 1 tablet by mouth 2 (two) times daily.   Yes [provider]    Allergies    Amoxicillin  Review of Systems   Review of Systems 10 systems reviewed and negative except as per HPI. Physical Exam Updated Vital Signs BP 103/67   Pulse 87   Temp 98.4 F (36.9 C) (Oral)   Resp 13   SpO2 100%   Physical Exam Constitutional:      Comments: On initial arrival and first exam, patient is lying on the floor with her chain restraints on.  She is hyperventilating and stiffening her entire body.  Appearance is for extreme exhibition of panic attack but not seizure activity.  HENT:     Head: Normocephalic and atraumatic.     Mouth/Throat:     Pharynx: Oropharynx is clear.  Eyes:     Comments: Initially pupils dilated approximately  5 mm and symmetric.  Cardiovascular:     Comments: Tachycardia 120s on the monitor regular. Pulmonary:     Comments: Patient is hyperventilating but no actual respiratory distress.  Lungs are grossly clear. Abdominal:     General: There is no distension.     Palpations: Abdomen is soft.     Tenderness: There is no abdominal tenderness. There is no guarding.  Musculoskeletal:        General: No swelling or tenderness. Normal range of motion.     Right lower leg: No edema.     Left lower leg: No edema.  Skin:    General: Skin is warm and dry.  Neurological:     Comments: On initial evaluation, patient does have corneal reflexes and blinks her eyes with/touch.   She was extending her feet and doing a maneuver similar to the opisthotonus.  During this she could be distracted to gaze my way with attention-getting maneuvers.  Once this is resolved, all motor function intact.     ED Results / Procedures / Treatments   Labs (all labs ordered are listed, but only abnormal results are displayed) Labs Reviewed  COMPREHENSIVE METABOLIC PANEL - Abnormal; Notable for the following components:      Result Value   Potassium 2.9 (*)    CO2 14 (*)    Glucose, Bld 129 (*)    Creatinine, Ser 1.08 (*)    Anion gap 22 (*)    All other components within normal limits  ACETAMINOPHEN LEVEL - Abnormal; Notable for the following components:   Acetaminophen (Tylenol), Serum <10 (*)    All other components within normal limits  URINALYSIS, ROUTINE W REFLEX MICROSCOPIC - Abnormal; Notable for the following components:   APPearance HAZY (*)    Ketones, ur 80 (*)    Protein, ur 100 (*)    Leukocytes,Ua TRACE (*)    Bacteria, UA RARE (*)    All other components within normal limits  RAPID URINE DRUG SCREEN, HOSP PERFORMED - Abnormal; Notable for the following components:   Tetrahydrocannabinol POSITIVE (*)    All other components within normal limits  PHOSPHORUS - Abnormal; Notable for the following components:   Phosphorus 1.7 (*)    All other components within normal limits  ETHANOL  CBC WITH DIFFERENTIAL/PLATELET  MAGNESIUM  TSH  BASIC METABOLIC PANEL  I-STAT BETA HCG BLOOD, ED ([facility], [facility], [facility] ONLY)    EKG EKG Interpretation  Date/Time:  Thursday December 15 2020 11:43:53 EST Ventricular Rate:  115 PR Interval:    QRS Duration: 86 QT Interval:  372 QTC Calculation: 515 R Axis:   75 Text Interpretation: Sinus tachycardia Right atrial enlargement no sig change from previous except rate related lateral ST depression Confirmed by Arby Barrette [id number]) on 12/15/2020 4:54:09 PM   Radiology No results found.  Procedures Procedures (including critical  care time) CRITICAL CARE Performed by: Arby Barrette   Total critical care time: 30 minutes  Critical care time was exclusive of separately billable procedures and treating other patients.  Critical care was necessary to treat or prevent imminent or life-threatening deterioration.  Critical care was time spent personally by me on the following activities: development of treatment plan with patient and/or surrogate as well as nursing, discussions with consultants, evaluation of patient's response to treatment, examination of patient, obtaining history from patient or surrogate, ordering and performing treatments and interventions, ordering and review of laboratory studies, ordering and review of radiographic studies, pulse oximetry and re-evaluation of  patient's condition. Medications Ordered in ED Medications  lactated ringers bolus 1,000 mL (0 mLs Intravenous Stopped 12/15/20 1433)  haloperidol lactate (HALDOL) injection 2 mg (2 mg Intravenous Given 12/15/20 1214)  lactated ringers bolus 2,000 mL (2,000 mLs Intravenous New Bag/Given 12/15/20 1526)  potassium chloride SA (KLOR-CON) CR tablet 40 mEq (40 mEq Oral Given 12/15/20 1531)  potassium chloride 10 mEq in 100 mL IVPB (10 mEq Intravenous New Bag/Given 12/15/20 1527)    ED Course  I have reviewed the triage vital signs and the nursing notes.  Pertinent labs & imaging results that were available during my care of the patient were reviewed by me and considered in my medical decision making (see chart for details).    MDM Rules/Calculators/A&P                         Patient presented as outlined with episodes of hyperventilating and appearance of panic attack with some disassociation.  These episodes did not appear to be seizures.  Patient has been treated in jail for opioid withdrawal.  Her drug screen does test positive for marijuana.  Patient reports she thinks she is dehydrated because of withdrawal and decreased ability to eat  and drink.  This time, she is being rehydrated with lactated Ringer's.  She was aggressively hyperventilating at my first evaluation with pedal spasm.  She however had responsive ocular motions and would look directly at me while this was occurring.  I had suspicion of panic attack, possibly induced by THC consumption.  The THC test positive on her drug screen.  Patient was given Haldol 2 mg IV and aggressively rehydrated.  Potassium also replaced.  Upon recheck at 16: 45, patient is much better.  Heart is sinus rhythm in the 80s, patient is alert and in no sign of distress watching television with her guard at bedside.  Her speech is clear with no signs of confusion.  She reports she feels much better.  Anticipate discharge.  Dr. Stevie Kern will recheck basic chemistry panel for correction of potassium and anion gap.  Final Clinical Impression(s) / ED Diagnoses Final diagnoses:  Hyperventilation  Polysubstance abuse (HCC)  Panic attack    Rx / DC Orders ED Discharge Orders    None       Arby Barrette, MD 12/15/20 1700

## 2020-12-15 NOTE — ED Triage Notes (Signed)
Pt presents from jail where she became increasingly anxious. EMS states she has been there x 7 days and has been detoxing  From opiates since that time. BP was 190s systolic upon their arrival but is normotensive now. Alert and oriented. 20g LFA.

## 2020-12-15 NOTE — ED Provider Notes (Signed)
Signout note  29 year old presents to ER with concern for opioid withdrawal, seizure-like activity.  Episode occurred in ER, suspected panic attack.  Basic labs noted for hypokalemia and anion gap.  Patient was provided fluids, potassium replacement.  5:00 PM Received signout from Dr. Clarice Pole, anticipate discharge after fluids and repeat BMP  6:40 PM Reviewed repeat BMP, persistent hyperkalemia and acidosis; reassessed patient, well appearing with normal vitals, denies any ingestion, has been in jail, no access to toxic alcohols. Will check VBG, lactic acid, provide more fluids, mag, and K, reviewed with nephrology, Malen Gauze, agrees with current plan   11:37 PM VBG shows normal pH with normal bicarbonate level, lactic acid is normal, on reassessment of patient, she is well appearing with normal vital signs.  Believe she is stable for discharge and outpatient management.  Recommend she have her lab work rechecked by primary doctor within the next few days to 1 week.   Cheryl Loll, MD 12/15/20 [phone]

## 2020-12-18 ENCOUNTER — Emergency Department (HOSPITAL_COMMUNITY)
Admission: EM | Admit: 2020-12-18 | Discharge: 2020-12-18 | Disposition: A | Discharge status: Home / Self Care | Payer: Medicaid Other | Attending: Emergency Medicine | Admitting: Emergency Medicine

## 2020-12-18 ENCOUNTER — Encounter (HOSPITAL_COMMUNITY): Payer: Self-pay | Admitting: Emergency Medicine

## 2020-12-18 DIAGNOSIS — Z87891 Personal history of nicotine dependence: Secondary | ICD-10-CM | POA: Diagnosis not present

## 2020-12-18 DIAGNOSIS — E876 Hypokalemia: Secondary | ICD-10-CM | POA: Diagnosis not present

## 2020-12-18 DIAGNOSIS — R112 Nausea with vomiting, unspecified: Secondary | ICD-10-CM | POA: Diagnosis not present

## 2020-12-18 DIAGNOSIS — N939 Abnormal uterine and vaginal bleeding, unspecified: Secondary | ICD-10-CM | POA: Insufficient documentation

## 2020-12-18 LAB — COMPREHENSIVE METABOLIC PANEL
ALT: 14 U/L (ref 0–44)
AST: 26 U/L (ref 15–41)
Albumin: 4.5 g/dL (ref 3.5–5.0)
Alkaline Phosphatase: 47 U/L (ref 38–126)
Anion gap: 14 (ref 5–15)
BUN: <5 mg/dL — ABNORMAL LOW (ref 6–20)
CO2: 23 mmol/L (ref 22–32)
Calcium: 9.5 mg/dL (ref 8.9–10.3)
Chloride: 102 mmol/L (ref 98–111)
Creatinine, Ser: 0.69 mg/dL (ref 0.44–1.00)
GFR, Estimated: >60 mL/min (ref >60)
Glucose, Bld: 121 mg/dL — ABNORMAL HIGH (ref 70–99)
Potassium: 3.3 mmol/L — ABNORMAL LOW (ref 3.5–5.1)
Sodium: 139 mmol/L (ref 135–145)
Total Bilirubin: 0.5 mg/dL (ref 0.3–1.2)
Total Protein: 7.8 g/dL (ref 6.5–8.1)

## 2020-12-18 LAB — CBC
HCT: 41.9 % (ref 36.0–46.0)
Hemoglobin: 14.3 g/dL (ref 12.0–15.0)
MCH: 29.5 pg (ref 26.0–34.0)
MCHC: 34.1 g/dL (ref 30.0–36.0)
MCV: 86.4 fL (ref 80.0–100.0)
Platelets: 330 10*3/uL (ref 150–400)
RBC: 4.85 MIL/uL (ref 3.87–5.11)
RDW: 13.5 % (ref 11.5–15.5)
WBC: 6.2 10*3/uL (ref 4.0–10.5)
nRBC: 0 % (ref 0.0–0.2)

## 2020-12-18 LAB — URINALYSIS, ROUTINE W REFLEX MICROSCOPIC
Bacteria, UA: NONE SEEN
Bilirubin Urine: NEGATIVE
Glucose, UA: NEGATIVE mg/dL
Ketones, ur: 80 mg/dL — AB
Nitrite: NEGATIVE
Protein, ur: 100 mg/dL — AB
RBC / HPF: >50 RBC/hpf — ABNORMAL HIGH (ref 0–5)
Specific Gravity, Urine: 1.02 (ref 1.005–1.030)
pH: 6 (ref 5.0–8.0)

## 2020-12-18 LAB — PREGNANCY, URINE: Preg Test, Ur: NEGATIVE

## 2020-12-18 MED ORDER — POLYETHYLENE GLYCOL 3350 17 G PO PACK
17.0000 g | PACK | Freq: Every day | ORAL | Status: DC
  Administered 2020-12-18: 17 g via ORAL
  Filled 2020-12-18: qty 1

## 2020-12-18 MED ORDER — POTASSIUM CHLORIDE CRYS ER 20 MEQ PO TBCR
40.0000 meq | EXTENDED_RELEASE_TABLET | Freq: Once | ORAL | Status: AC
  Administered 2020-12-18: 40 meq via ORAL
  Filled 2020-12-18: qty 2

## 2020-12-18 NOTE — ED Triage Notes (Signed)
Per EMS, patient in custody, patient c/o vomiting with blood at 0300 today. Reports two episodes. Patient reports heavier vaginal bleeding than normal for her menstrual cycle x2 days. Hx anemia. Seen x3 days ago for seizures and hypokalemic at that time.

## 2020-12-18 NOTE — Discharge Instructions (Signed)
Follow-up with your primary care provider for recheck of your labs in 1 week

## 2020-12-18 NOTE — ED Notes (Signed)
Attempted to get pt's blood work but was unsuccessful

## 2020-12-18 NOTE — ED Provider Notes (Signed)
[facility] Provider Note   CSN: [id number] Arrival date & time: 12/18/20  1159     History Chief Complaint  Patient presents with  . Hematemesis  . Vaginal Bleeding    Cheryl Green is a 30 y.o. female with past medical history significant for substance use, anxiety attack who presents for evaluation multiple complaints.  Currently incarcerated.  Was seen 3 days ago for possible seizure-like activity.  Noted to have low potassium.  Was supplemented.  Patient states she had one episode of what she thought was bloody emesis.  States she had recently drank a cranberry juice.  States 30 minutes later she felt a gurgling and had a subsequent episode of emesis.  No clots.  No associated abdominal pain.  Patient states approximately 1 minute later she had a second episode of emesis.  States this was more "yellow" in color.  She denies any bright red blood, feculent emesis.  Patient states she did recently start her menstrual cycle 2 days ago.  Has been "heavier" than her normal cycles.  She is wearing a pad and changing it every 3 hours.  Denies chance of pregnancy.  States occasionally she does get emesis when she gets her menstrual cycles.  She denies any fever, chills, chest pain, shortness of breath, rashes, lesions, abdominal pain, pelvic pain, dysuria, diarrhea, constipation, paresthesias or weakness. Was able to eat and drink post emesis without difficulty. No hx of NSAID use, chronic EtOh Denies additional aggravating or alleviating factors.  History obtained from patient past medical records.  No interpreter used.  Patient here in custody of [employer].  HPI     Past Medical History:  Diagnosis Date  . ADHD (attention deficit hyperactivity disorder)   . Anxiety attack   . Scoliosis     There are no problems to display for this patient.   History reviewed. No pertinent surgical history.   OB History   No obstetric history on file.     No  family history on file.  Social History   Tobacco Use  . Smoking status: Former Smoker    Types: Cigarettes  . Smokeless tobacco: Never Used  Substance Use Topics  . Alcohol use: Yes    Comment: ocassionally  . Drug use: No    Home Medications Prior to Admission medications   Medication Sig Start Date End Date Taking? Authorizing Provider  acetaminophen (TYLENOL) 325 MG tablet Take 650 mg by mouth every 6 (six) hours as needed for mild pain, fever or headache.    [provider]  escitalopram (LEXAPRO) 10 MG tablet Take 30 mg by mouth daily.    [provider]  hydrOXYzine (ATARAX/VISTARIL) 50 MG tablet Take 50 mg by mouth in the morning and at bedtime.    [provider]  loperamide (IMODIUM A-D) 2 MG tablet Take 4 mg by mouth 4 (four) times daily as needed for diarrhea or loose stools.    [provider]  meclizine (ANTIVERT) 25 MG tablet Take 25 mg by mouth 3 (three) times daily as needed for dizziness.    [provider]  Potassium Chloride ER 20 MEQ TBCR Take 20 mEq by mouth daily for 7 days. 12/15/20 12/22/20  Milagros Loll, MD  sulfamethoxazole-trimethoprim (BACTRIM DS) 800-160 MG tablet Take 1 tablet by mouth 2 (two) times daily.    [provider]    Allergies    Amoxicillin  Review of Systems   Review of Systems  Constitutional:  Negative.   HENT: Negative.   Respiratory: Negative.   Cardiovascular: Negative.   Gastrointestinal: Positive for vomiting. Negative for abdominal distention, abdominal pain, anal bleeding, blood in stool, constipation, diarrhea, nausea and rectal pain.  Genitourinary: Positive for menstrual problem. Negative for decreased urine volume, difficulty urinating, dysuria, flank pain, frequency, genital sores, hematuria, pelvic pain, urgency, vaginal bleeding, vaginal discharge and vaginal pain.  Musculoskeletal: Negative.   Skin: Negative.   Neurological: Negative.   All other systems  reviewed and are negative.   Physical Exam Updated Vital Signs BP 117/87 (BP Location: Right Arm)   Pulse 81   Temp 98.5 F (36.9 C) (Oral)   Resp 16   LMP 12/18/2020   SpO2 99%   Physical Exam Vitals and nursing note reviewed.  Constitutional:      General: She is not in acute distress.    Appearance: She is well-developed and well-nourished. She is not ill-appearing, toxic-appearing or diaphoretic.  HENT:     Head: Normocephalic and atraumatic.     Nose: Nose normal.     Mouth/Throat:     Mouth: Mucous membranes are moist.  Eyes:     Pupils: Pupils are equal, round, and reactive to light.  Cardiovascular:     Rate and Rhythm: Normal rate.     Pulses: Normal pulses and intact distal pulses.     Heart sounds: Normal heart sounds.  Pulmonary:     Effort: Pulmonary effort is normal. No respiratory distress.     Breath sounds: Normal breath sounds.  Abdominal:     General: Bowel sounds are normal. There is no distension.     Palpations: There is no mass.     Tenderness: There is no abdominal tenderness. There is no right CVA tenderness, left CVA tenderness, guarding or rebound.     Hernia: No hernia is present.  Musculoskeletal:        General: No swelling, tenderness, deformity or signs of injury. Normal range of motion.     Cervical back: Normal range of motion and neck supple. No rigidity or tenderness.     Right lower leg: No edema.     Left lower leg: No edema.  Skin:    General: Skin is warm and dry.     Capillary Refill: Capillary refill takes less than 2 seconds.  Neurological:     General: No focal deficit present.     Mental Status: She is alert and oriented to person, place, and time.  Psychiatric:        Mood and Affect: Mood and affect normal.     ED Results / Procedures / Treatments   Labs (all labs ordered are listed, but only abnormal results are displayed) Labs Reviewed  COMPREHENSIVE METABOLIC PANEL - Abnormal; Notable for the following  components:      Result Value   Potassium 3.3 (*)    Glucose, Bld 121 (*)    BUN <5 (*)    All other components within normal limits  URINALYSIS, ROUTINE W REFLEX MICROSCOPIC - Abnormal; Notable for the following components:   APPearance HAZY (*)    Hgb urine dipstick LARGE (*)    Ketones, ur 80 (*)    Protein, ur 100 (*)    Leukocytes,Ua TRACE (*)    RBC / HPF >50 (*)    All other components within normal limits  CBC  PREGNANCY, URINE  TYPE AND SCREEN    EKG None  Radiology No results found.  Procedures Procedures (including  critical care time)  Medications Ordered in ED Medications  polyethylene glycol (MIRALAX / GLYCOLAX) packet 17 g (17 g Oral Given 12/18/20 1855)  potassium chloride SA (KLOR-CON) CR tablet 40 mEq (40 mEq Oral Given 12/18/20 1854)    ED Course  I have reviewed the triage vital signs and the nursing notes.  Pertinent labs & imaging results that were available during my care of the patient were reviewed by me and considered in my medical decision making (see chart for details).  Patient with extended wait time in the ED of greater than 6 hours in lobby prior to my assessment.  30 year old presents for evaluation of multiple complaints. Afebrile, non septic, non ill appearing.  Was seen 2 days ago for likely pseudoseizure.  Noted to be hypokalemic.  Apparently had a cranberry juice yesterday evening.  Subsequently had 2 episodes of emesis.  Patient states this was red-tinged and she was concerned that there is blood in this.  She has been able to tolerate p.o. intake without difficulty since.  History of upper GI bleeds.  No melanotic or bright red blood in stool.  Abdomen soft, nontender.  No history of chronic NSAID use, EtOH use, esophageal varices or gastritis.  Patient also concerned that she start her menstrual cycle 2 days ago.  She feels this is "heavier" than her normal menstrual cycle.  She is going through a pad every 4 hours.  No associated lower  abdominal pain, cramping.  No lightheadedness or dizziness.  Her abdomen is soft, nontender.  She denies any concerns for STDs.  She has a nonfocal neuro exam without deficits.  She has normal musculoskeletal exam.  Labs obtained from triage which I personally reviewed and interpreted:  CBC without leukocytosis, hemoglobin 14.3, improved from 3 days prior Metabolic panel with hypokalemia at 3.3, glucose 121, BUN less than 5.  No additional electrolyte, renal abnormality will give replacement of potassium given history of hypokalemia. UA negative for infection Pregnancy test negative  Patient ambulatory in ED with out difficulty.  Apeers otherwise well.  Has been here in the emergency department for 7 hours, tolerating p.o. intake with stable vital signs.  Labs reassuring.  With regards to her heavier than normal menstrual cycle encourage patient to follow-up outpatient with PCP  for reevaluation.  Low suspicion for hemorrhage.  She did mention she takes Miralax at home for [facility]. States [employer] has not been giving this to her. Will give dose here in ED. Reevaluation abdomen soft, nontender.  Low suspicion for acute intra-abdominal process.  Patient's alleged blood in emesis, seems she had just had cranberry juice just prior to her emesis.  Reassuring BUN, no risk factors for upper GI bleed.  Feel patient's right emesis was likely due to her cranberry juice.  Low suspicion for large upper GI bleed.  DC back into custody of [employer].  Stable at discharge.  The patient has been appropriately medically screened and/or stabilized in the ED. I have low suspicion for any other emergent medical condition which would require further screening, evaluation or treatment in the ED or require inpatient management.  Patient is hemodynamically stable and in no acute distress.  Patient able to ambulate in department prior to ED.  Evaluation does not show acute pathology that would require ongoing or additional emergent  interventions while in the emergency department or further inpatient treatment.  I have discussed the diagnosis with the patient and answered all questions.  Pain is been managed while in the  emergency department and patient has no further complaints prior to discharge.  Patient is comfortable with plan discussed in room and is stable for discharge at this time.  I have discussed strict return precautions for returning to the emergency department.  Patient was encouraged to follow-up with PCP/specialist refer to at discharge.   MDM Rules/Calculators/A&P                           Final Clinical Impression(s) / ED Diagnoses Final diagnoses:  Vaginal bleeding  Nausea and vomiting, intractability of vomiting not specified, unspecified vomiting type  Hypokalemia    Rx / DC Orders ED Discharge Orders    None       Sevin Farone A, PA-C 12/18/20 1931    Pattricia Boss, MD 12/18/20 1948

## 2020-12-19 LAB — TYPE AND SCREEN
ABO/RH(D): O NEG
Antibody Screen: NEGATIVE
Weak D: POSITIVE

## 2020-12-19 NOTE — ED Notes (Signed)
Opened chart at request of Tomi Likens from [facility] to send labs from discharge last night to physician at jail to create careplan

## 2021-04-27 ENCOUNTER — Emergency Department (HOSPITAL_BASED_OUTPATIENT_CLINIC_OR_DEPARTMENT_OTHER)
Admission: EM | Admit: 2021-04-27 | Discharge: 2021-04-27 | Disposition: A | Discharge status: Home / Self Care | Payer: Medicaid Other | Attending: Emergency Medicine | Admitting: Emergency Medicine

## 2021-04-27 ENCOUNTER — Other Ambulatory Visit: Payer: Self-pay

## 2021-04-27 ENCOUNTER — Encounter (HOSPITAL_BASED_OUTPATIENT_CLINIC_OR_DEPARTMENT_OTHER): Payer: Self-pay | Admitting: *Deleted

## 2021-04-27 DIAGNOSIS — H00015 Hordeolum externum left lower eyelid: Secondary | ICD-10-CM

## 2021-04-27 DIAGNOSIS — H5789 Other specified disorders of eye and adnexa: Secondary | ICD-10-CM | POA: Diagnosis present

## 2021-04-27 DIAGNOSIS — F17210 Nicotine dependence, cigarettes, uncomplicated: Secondary | ICD-10-CM | POA: Diagnosis not present

## 2021-04-27 MED ORDER — TETRACAINE HCL 0.5 % OP SOLN
2.0000 [drp] | Freq: Once | OPHTHALMIC | Status: AC
  Administered 2021-04-27: 2 [drp] via OPHTHALMIC
  Filled 2021-04-27: qty 4

## 2021-04-27 MED ORDER — ONDANSETRON 4 MG PO TBDP
4.0000 mg | ORAL_TABLET | Freq: Once | ORAL | Status: DC
  Filled 2021-04-27: qty 1

## 2021-04-27 MED ORDER — NEOMYCIN-POLYMYXIN-DEXAMETH 3.5-10000-0.1 OP OINT
TOPICAL_OINTMENT | Freq: Three times a day (TID) | OPHTHALMIC | Status: DC
  Filled 2021-04-27: qty 3.5

## 2021-04-27 MED ORDER — FLUORESCEIN SODIUM 1 MG OP STRP
1.0000 | ORAL_STRIP | Freq: Once | OPHTHALMIC | Status: AC
  Administered 2021-04-27: 1 via OPHTHALMIC
  Filled 2021-04-27: qty 1

## 2021-04-27 NOTE — ED Triage Notes (Signed)
Pt reports left eye pain that started after waking this morning. Denies known injury. No change in vision

## 2021-04-27 NOTE — Discharge Instructions (Addendum)
You likely have a stye that got infected.  Please use Maxitrol ointment 3 times daily.  Use warm compresses as well  Please contact Dr. Randon Goldsmith' office tomorrow.  He states that he can see you tomorrow in the office  Return to ER if you have worse eye swelling and pain and fever and purulent discharge

## 2021-04-27 NOTE — ED Provider Notes (Signed)
[facility] EMERGENCY DEPARTMENT Provider Note   CSN: [id number] Arrival date & time: 04/27/21  1626     History Chief Complaint  Patient presents with  . Eye Pain    Judine B Macfadden is a 30 y.o. female history of ADHD, panic attack here presenting with left eye swelling.  Patient states that she woke up this morning and noticed the palm of her left eye and her eyelid became more swollen.  She was very anxious and started throwing up.  She states that it is painful.  She denies any purulent discharge.  Denies any fevers.  Denies anything getting into her eye.  Denies any blurry vision  The history is provided by the patient.       Past Medical History:  Diagnosis Date  . ADHD (attention deficit hyperactivity disorder)   . Anxiety attack   . Scoliosis     There are no problems to display for this patient.   History reviewed. No pertinent surgical history.   OB History   No obstetric history on file.     History reviewed. No pertinent family history.  Social History   Tobacco Use  . Smoking status: Current Every Day Smoker    Types: Cigarettes  . Smokeless tobacco: Never Used  Substance Use Topics  . Alcohol use: Yes    Comment: ocassionally  . Drug use: No    Home Medications Prior to Admission medications   Medication Sig Start Date End Date Taking? Authorizing Provider  acetaminophen (TYLENOL) 325 MG tablet Take 650 mg by mouth every 6 (six) hours as needed for mild pain, fever or headache.    [provider]  escitalopram (LEXAPRO) 10 MG tablet Take 30 mg by mouth daily.    [provider]  hydrOXYzine (ATARAX/VISTARIL) 50 MG tablet Take 50 mg by mouth in the morning and at bedtime.    [provider]  loperamide (IMODIUM A-D) 2 MG tablet Take 4 mg by mouth 4 (four) times daily as needed for diarrhea or loose stools.    [provider]  meclizine (ANTIVERT) 25 MG tablet Take 25 mg by mouth 3 (three) times  daily as needed for dizziness.    [provider]  Potassium Chloride ER 20 MEQ TBCR Take 20 mEq by mouth daily for 7 days. 12/15/20 12/22/20  Milagros Loll, MD  sulfamethoxazole-trimethoprim (BACTRIM DS) 800-160 MG tablet Take 1 tablet by mouth 2 (two) times daily.    [provider]    Allergies    Amoxicillin  Review of Systems   Review of Systems  Eyes: Positive for pain.  All other systems reviewed and are negative.   Physical Exam Updated Vital Signs BP 114/60 (BP Location: Left Arm)   Pulse 62   Temp 97.9 F (36.6 C) (Oral)   Resp 18   Ht 5\' 8"  (1.727 m)   Wt 68 kg   LMP 04/13/2021   SpO2 100%   BMI 22.81 kg/m   Physical Exam Vitals reviewed.  Constitutional:      Comments: Anxious, vomiting   HENT:     Head: Normocephalic.     Nose: Nose normal.     Mouth/Throat:     Mouth: Mucous membranes are moist.  Eyes:     Comments: Extraocular movements are intact.  Patient appears to have some ecchymosis on the lower aspect of the left eye.  The left eyelid is also swollen.  There is no obvious conjunctivitis.  There  is no obvious foreign body noted under the eyelid or any uptake on fluorescein Patient's eye pressure is 20 in the left eye and 14 in the right eye.  Patient has no sinus tenderness  Cardiovascular:     Rate and Rhythm: Normal rate and regular rhythm.     Pulses: Normal pulses.     Heart sounds: Normal heart sounds.  Pulmonary:     Effort: Pulmonary effort is normal.     Breath sounds: Normal breath sounds.  Abdominal:     General: Abdomen is flat.     Palpations: Abdomen is soft.  Musculoskeletal:        General: Normal range of motion.     Cervical back: Normal range of motion and neck supple.  Skin:    General: Skin is warm.     Capillary Refill: Capillary refill takes less than 2 seconds.  Neurological:     General: No focal deficit present.     Mental Status: She is alert.  Psychiatric:        Mood and Affect: Mood  normal.        Behavior: Behavior normal.     ED Results / Procedures / Treatments   Labs (all labs ordered are listed, but only abnormal results are displayed) Labs Reviewed - No data to display  EKG None  Radiology No results found.  Procedures Procedures   Medications Ordered in ED Medications  ondansetron (ZOFRAN-ODT) disintegrating tablet 4 mg (4 mg Oral Patient Refused/Not Given 04/27/21 1658)  neomycin-polymyxin b-dexamethasone (MAXITROL) ophthalmic ointment (has no administration in time range)  tetracaine (PONTOCAINE) 0.5 % ophthalmic solution 2 drop (2 drops Right Eye Given 04/27/21 1643)  fluorescein ophthalmic strip 1 strip (1 strip Left Eye Given 04/27/21 1644)    ED Course  I have reviewed the triage vital signs and the nursing notes.  Pertinent labs & imaging results that were available during my care of the patient were reviewed by me and considered in my medical decision making (see chart for details).    MDM Rules/Calculators/A&P                         Lavida B Commerford is a 30 y.o. female here with left lower eyelid swelling.  Patient has mild chemosis on the lower aspect of the left eye.  There is no foreign body visible visually or corneal abrasion on fluorescein stain.  Patient has normal eye pressure. Will consult ophtho   5:05 PM Talked to Dr. Randon Goldsmith.  He states that this is likely hordeolum.  Recommend Maxitrol.  We are able to give her the ointment here.  Recommend Maxitrol 3 times daily and warm compresses. He states that he can see patient tomorrow in his office.  I offered some Zofran for nausea and vomiting but she refused  Final Clinical Impression(s) / ED Diagnoses Final diagnoses:  None    Rx / DC Orders ED Discharge Orders    None       Charlynne Pander, MD 04/27/21 1705

## 2021-04-27 NOTE — ED Notes (Signed)
ED Provider at bedside. 

## 2021-05-21 ENCOUNTER — Encounter (HOSPITAL_BASED_OUTPATIENT_CLINIC_OR_DEPARTMENT_OTHER): Payer: Self-pay

## 2021-05-21 ENCOUNTER — Emergency Department (HOSPITAL_BASED_OUTPATIENT_CLINIC_OR_DEPARTMENT_OTHER)
Admission: EM | Admit: 2021-05-21 | Discharge: 2021-05-22 | Disposition: A | Discharge status: Home / Self Care | Payer: Medicaid Other | Attending: Emergency Medicine | Admitting: Emergency Medicine

## 2021-05-21 ENCOUNTER — Other Ambulatory Visit: Payer: Self-pay

## 2021-05-21 DIAGNOSIS — L03213 Periorbital cellulitis: Secondary | ICD-10-CM | POA: Insufficient documentation

## 2021-05-21 DIAGNOSIS — Z5321 Procedure and treatment not carried out due to patient leaving prior to being seen by health care provider: Secondary | ICD-10-CM | POA: Diagnosis not present

## 2021-05-21 DIAGNOSIS — L731 Pseudofolliculitis barbae: Secondary | ICD-10-CM | POA: Diagnosis not present

## 2021-05-21 DIAGNOSIS — Z87891 Personal history of nicotine dependence: Secondary | ICD-10-CM | POA: Insufficient documentation

## 2021-05-21 NOTE — ED Triage Notes (Signed)
Reports pulling an ingrown hair from left eye brow 2 days ago. Since it has swollen and is red and sore.

## 2021-05-21 NOTE — ED Notes (Signed)
No answer in lobby x 4.

## 2021-05-22 ENCOUNTER — Emergency Department (HOSPITAL_COMMUNITY): Payer: Medicaid Other

## 2021-05-22 ENCOUNTER — Emergency Department (HOSPITAL_COMMUNITY)
Admission: EM | Admit: 2021-05-22 | Discharge: 2021-05-22 | Disposition: A | Discharge status: Home / Self Care | Payer: Medicaid Other | Source: Home / Self Care | Attending: Emergency Medicine | Admitting: Emergency Medicine

## 2021-05-22 ENCOUNTER — Emergency Department (HOSPITAL_COMMUNITY)
Admission: EM | Admit: 2021-05-22 | Discharge: 2021-05-22 | Discharge status: Against Medical Advice | Payer: Medicaid Other

## 2021-05-22 ENCOUNTER — Encounter (HOSPITAL_COMMUNITY): Payer: Self-pay

## 2021-05-22 DIAGNOSIS — Z87891 Personal history of nicotine dependence: Secondary | ICD-10-CM | POA: Insufficient documentation

## 2021-05-22 DIAGNOSIS — L03213 Periorbital cellulitis: Secondary | ICD-10-CM

## 2021-05-22 LAB — CBC WITH DIFFERENTIAL/PLATELET
Abs Immature Granulocytes: 0.02 10*3/uL (ref 0.00–0.07)
Basophils Absolute: 0 10*3/uL (ref 0.0–0.1)
Basophils Relative: 0 %
Eosinophils Absolute: 0.4 10*3/uL (ref 0.0–0.5)
Eosinophils Relative: 4 %
HCT: 40 % (ref 36.0–46.0)
Hemoglobin: 13 g/dL (ref 12.0–15.0)
Immature Granulocytes: 0 %
Lymphocytes Relative: 18 %
Lymphs Abs: 1.7 10*3/uL (ref 0.7–4.0)
MCH: 29.7 pg (ref 26.0–34.0)
MCHC: 32.5 g/dL (ref 30.0–36.0)
MCV: 91.5 fL (ref 80.0–100.0)
Monocytes Absolute: 0.5 10*3/uL (ref 0.1–1.0)
Monocytes Relative: 6 %
Neutro Abs: 6.5 10*3/uL (ref 1.7–7.7)
Neutrophils Relative %: 72 %
Platelets: 229 10*3/uL (ref 150–400)
RBC: 4.37 MIL/uL (ref 3.87–5.11)
RDW: 12.4 % (ref 11.5–15.5)
WBC: 9.1 10*3/uL (ref 4.0–10.5)
nRBC: 0 % (ref 0.0–0.2)

## 2021-05-22 LAB — BASIC METABOLIC PANEL
Anion gap: 6 (ref 5–15)
BUN: 8 mg/dL (ref 6–20)
CO2: 29 mmol/L (ref 22–32)
Calcium: 8.8 mg/dL — ABNORMAL LOW (ref 8.9–10.3)
Chloride: 104 mmol/L (ref 98–111)
Creatinine, Ser: 0.63 mg/dL (ref 0.44–1.00)
GFR, Estimated: >60 mL/min (ref >60)
Glucose, Bld: 113 mg/dL — ABNORMAL HIGH (ref 70–99)
Potassium: 4 mmol/L (ref 3.5–5.1)
Sodium: 139 mmol/L (ref 135–145)

## 2021-05-22 LAB — POC URINE PREG, ED: Preg Test, Ur: NEGATIVE

## 2021-05-22 MED ORDER — CEFDINIR 300 MG PO CAPS: 300.0000 mg | ORAL_CAPSULE | Freq: Two times a day (BID) | ORAL | 0 refills | Status: AC

## 2021-05-22 MED ORDER — DOXYCYCLINE HYCLATE 100 MG PO CAPS: 100.0000 mg | ORAL_CAPSULE | Freq: Two times a day (BID) | ORAL | 0 refills | Status: AC

## 2021-05-22 MED ORDER — DOXYCYCLINE HYCLATE 100 MG PO CAPS: 100.0000 mg | ORAL_CAPSULE | Freq: Two times a day (BID) | ORAL | 0 refills | Status: DC

## 2021-05-22 MED ORDER — IOHEXOL 300 MG/ML  SOLN
75.0000 mL | Freq: Once | INTRAMUSCULAR | Status: AC | PRN
  Administered 2021-05-22: 75 mL via INTRAVENOUS

## 2021-05-22 MED ORDER — KETOROLAC TROMETHAMINE 30 MG/ML IJ SOLN
30.0000 mg | Freq: Once | INTRAMUSCULAR | Status: AC
  Administered 2021-05-22: 30 mg via INTRAVENOUS
  Filled 2021-05-22: qty 1

## 2021-05-22 MED ORDER — SODIUM CHLORIDE (PF) 0.9 % IJ SOLN
INTRAMUSCULAR | Status: AC
  Filled 2021-05-22: qty 50

## 2021-05-22 NOTE — ED Provider Notes (Signed)
[facility]-EMERGENCY DEPT Provider Note   CSN: [id number] Arrival date & time: 05/22/21  2671     History Chief Complaint  Patient presents with  . Rash    Cheryl Green is a 30 y.o. female with PMHx ADHD, anxiety, and previous suboxone use (2021) who presents to the ED today with complaint of gradual onset, constant, sharp/throbbing, left eye/eyebrow pain x 2 days. Pt reports she had an ingrown hair that she pulled with tweezers 2 days ago and since that time has had worsening symptoms. She has been taking 800 mg Ibuprofen and applying warm compresses without relief. She denies any fevers or chills. Per chart review pt went to [facility] last night however left without being seen. She reports the pain is so bad she feels like it is effecting her breathing through her nose. Denies SOB. No other complaints at this time.   The history is provided by the patient and medical records.       Past Medical History:  Diagnosis Date  . ADHD (attention deficit hyperactivity disorder)   . Anxiety attack   . Scoliosis     There are no problems to display for this patient.   History reviewed. No pertinent surgical history.   OB History   No obstetric history on file.     No family history on file.  Social History   Tobacco Use  . Smoking status: Former Games developer  . Smokeless tobacco: Never Used  Vaping Use  . Vaping Use: Never used  Substance Use Topics  . Alcohol use: Not Currently  . Drug use: No    Home Medications Prior to Admission medications   Medication Sig Start Date End Date Taking? Authorizing Provider  cefdinir (OMNICEF) 300 MG capsule Take 1 capsule (300 mg total) by mouth 2 (two) times daily for 7 days. 05/22/21 05/29/21 Yes Ambrosia Wisnewski, PA-C  acetaminophen (TYLENOL) 325 MG tablet Take 650 mg by mouth every 6 (six) hours as needed for mild pain, fever or headache.    [provider]  doxycycline (VIBRAMYCIN) 100 MG  capsule Take 1 capsule (100 mg total) by mouth 2 (two) times daily for 7 days. 05/22/21 05/29/21  Hyman Hopes, Haila Dena, PA-C  escitalopram (LEXAPRO) 10 MG tablet Take 30 mg by mouth daily.    [provider]  hydrOXYzine (ATARAX/VISTARIL) 50 MG tablet Take 50 mg by mouth in the morning and at bedtime.    [provider]  loperamide (IMODIUM A-D) 2 MG tablet Take 4 mg by mouth 4 (four) times daily as needed for diarrhea or loose stools.    [provider]  meclizine (ANTIVERT) 25 MG tablet Take 25 mg by mouth 3 (three) times daily as needed for dizziness.    [provider]  Potassium Chloride ER 20 MEQ TBCR Take 20 mEq by mouth daily for 7 days. 12/15/20 12/22/20  Milagros Loll, MD    Allergies    Amoxicillin  Review of Systems   Review of Systems  Constitutional: Negative for chills and fever.  Eyes: Positive for pain.  Skin: Positive for color change.  All other systems reviewed and are negative.   Physical Exam Updated Vital Signs BP 128/70 (BP Location: Left Arm)   Pulse 60   Temp 97.7 F (36.5 C) (Oral)   Resp 17   LMP 05/14/2021   SpO2 100%   Physical Exam Vitals and nursing note reviewed.  Constitutional:      Appearance: She is  not ill-appearing or diaphoretic.  HENT:     Head:     Comments: See photo below: Left periorbital edema and erythema. EOMI intact however pt endorses pain with EOM. PERRL. No conjunctival injection appreciated.  Eyes:     Conjunctiva/sclera: Conjunctivae normal.  Cardiovascular:     Rate and Rhythm: Normal rate and regular rhythm.  Pulmonary:     Effort: Pulmonary effort is normal.     Breath sounds: Normal breath sounds.  Skin:    General: Skin is warm and dry.     Coloration: Skin is not jaundiced.  Neurological:     Mental Status: She is alert.        ED Results / Procedures / Treatments   Labs (all labs ordered are listed, but only abnormal results are displayed) Labs Reviewed  BASIC  METABOLIC PANEL - Abnormal; Notable for the following components:      Result Value   Glucose, Bld 113 (*)    Calcium 8.8 (*)    All other components within normal limits  CBC WITH DIFFERENTIAL/PLATELET  POC URINE PREG, ED    EKG None  Radiology CT ORBITS W CONTRAST  Result Date: 05/22/2021 CLINICAL DATA:  Periorbital swelling and erythema EXAM: CT ORBITS WITH CONTRAST TECHNIQUE: Multidetector CT images was performed according to the standard protocol following intravenous contrast administration. CONTRAST:  36mL OMNIPAQUE IOHEXOL 300 MG/ML  SOLN COMPARISON:  No pertinent prior exam. FINDINGS: Orbits: Left periorbital soft tissue swelling. No postseptal extension. Globes, lacrimal glands, and extraocular muscles are symmetric and unremarkable. Optic nerve sheath complexes are unremarkable. Visible paranasal sinuses: Mild mucosal thickening. Soft tissues: As above.  Otherwise unremarkable. Osseous: Unremarkable. Limited intracranial: No abnormal enhancement. IMPRESSION: Left periorbital inflammatory changes. No evidence of postseptal extension or soft tissue abscess. Electronically Signed   By: Guadlupe Spanish M.D.   On: 05/22/2021 09:24    Procedures Procedures   Medications Ordered in ED Medications  sodium chloride (PF) 0.9 % injection (has no administration in time range)  ketorolac (TORADOL) 30 MG/ML injection 30 mg (30 mg Intravenous Given 05/22/21 0838)  iohexol (OMNIPAQUE) 300 MG/ML solution 75 mL (75 mLs Intravenous Contrast Given 05/22/21 3419)    ED Course  I have reviewed the triage vital signs and the nursing notes.  Pertinent labs & imaging results that were available during my care of the patient were reviewed by me and considered in my medical decision making (see chart for details).    MDM Rules/Calculators/A&P                          30 year old female who presents to the ED today with complaint of left eye pain and periorbital edema for the past 2 days.  On arrival  to the ED vitals are stable.  Patient is afebrile, nontachycardic and nontachypneic and appears to be in no acute distress.  On my exam she has obvious periorbital edema and erythema of the left eye with tenderness to palpation.  Her extraocular movements are intact however she endorses pain with same.  There is no conjunctival injection and her pupils equal round and reactive to light.  There is concern for possible preseptal versus septal cellulitis at this time.  It does appear patient has history of Suboxone use in 2021, she denies injecting into this area and states she pulled out an ingrown hair with tweezers prior to symptoms beginning.  Question recent drug use causing immunosuppression with possible infection spreading  into the septal area.  We will plan for CT orbit at this time for further evaluation. Have discussed case with attending physician Dr. Deretha Emory who agrees with plan.   Had initially ordered CT without contrast due to national shortage however received secure message from radiology department who recommends with contrast; they report they have enough contrast to obtain appropriate scan. Order has been changed. Will check creatinine prior to scan.   CBC without leukocytosis. Hgb stable.  BMP without electrolyte abnormalities.   CT orbits: IMPRESSION:  Left periorbital inflammatory changes. No evidence of postseptal  extension or soft tissue abscess.   Will treat for periorbital cellulitis at this time with PO doxycycline and cefdinir given amoxicillin allergy and PCP follow up. Pt in agreement with plan and stable for discharge home.   This note was prepared using Dragon voice recognition software and may include unintentional dictation errors due to the inherent limitations of voice recognition software.  Final Clinical Impression(s) / ED Diagnoses Final diagnoses:  Periorbital cellulitis of left eye    Rx / DC Orders ED Discharge Orders         Ordered    doxycycline  (VIBRAMYCIN) 100 MG capsule  2 times daily,   Status:  Discontinued        05/22/21 0938    doxycycline (VIBRAMYCIN) 100 MG capsule  2 times daily        05/22/21 0945    cefdinir (OMNICEF) 300 MG capsule  2 times daily        05/22/21 0945           Discharge Instructions     Please pick up antibiotics and take as prescribed to cover for your infection.  You can continue taking 600 mg Ibuprofen every 6 hours as needed for pain. You can also take 1,000 mg Tylenol every 8 hours as needed for pain.  Apply ice as needed for comfort.   Follow up with your PCP by the end of this week for further evaluation  Return to the ED if no improvement after 48 hours on antibiotics or any new/worsening symptoms.        Tanda Rockers, PA-C 05/22/21 1610    Vanetta Mulders, MD 05/23/21 [phone]

## 2021-05-22 NOTE — ED Triage Notes (Signed)
Patient arrived stating she pulled an ingrown hair from her left eye brow a couple days ago, area red and painful. Attempted cool compress and 800 mg Ibuprofen  4 hours prior to arrival with no relief.

## 2021-05-22 NOTE — Discharge Instructions (Signed)
Please pick up antibiotics and take as prescribed to cover for your infection.  You can continue taking 600 mg Ibuprofen every 6 hours as needed for pain. You can also take 1,000 mg Tylenol every 8 hours as needed for pain.  Apply ice as needed for comfort.   Follow up with your PCP by the end of this week for further evaluation  Return to the ED if no improvement after 48 hours on antibiotics or any new/worsening symptoms.

## 2021-05-22 NOTE — ED Notes (Signed)
Called Pt for vitals no answer. 

## 2021-05-23 ENCOUNTER — Emergency Department (HOSPITAL_COMMUNITY)
Admission: EM | Admit: 2021-05-23 | Discharge: 2021-05-23 | Discharge status: Against Medical Advice | Payer: Medicaid Other

## 2021-11-07 ENCOUNTER — Encounter (HOSPITAL_COMMUNITY): Payer: Self-pay

## 2021-11-07 ENCOUNTER — Emergency Department (HOSPITAL_COMMUNITY)
Admission: EM | Admit: 2021-11-07 | Discharge: 2021-11-07 | Disposition: A | Discharge status: Home / Self Care | Payer: Medicaid Other | Attending: Student | Admitting: Student

## 2021-11-07 ENCOUNTER — Other Ambulatory Visit: Payer: Self-pay

## 2021-11-07 DIAGNOSIS — K029 Dental caries, unspecified: Secondary | ICD-10-CM | POA: Diagnosis not present

## 2021-11-07 DIAGNOSIS — Z87891 Personal history of nicotine dependence: Secondary | ICD-10-CM | POA: Diagnosis not present

## 2021-11-07 DIAGNOSIS — H65192 Other acute nonsuppurative otitis media, left ear: Secondary | ICD-10-CM | POA: Diagnosis not present

## 2021-11-07 DIAGNOSIS — K0889 Other specified disorders of teeth and supporting structures: Secondary | ICD-10-CM | POA: Diagnosis present

## 2021-11-07 MED ORDER — LIDOCAINE VISCOUS HCL 2 % MT SOLN
15.0000 mL | Freq: Once | OROMUCOSAL | Status: DC
  Filled 2021-11-07: qty 15

## 2021-11-07 MED ORDER — OXYCODONE-ACETAMINOPHEN 5-325 MG PO TABS: 1.0000 | ORAL_TABLET | Freq: Three times a day (TID) | ORAL | 0 refills | Status: AC | PRN

## 2021-11-07 MED ORDER — CLINDAMYCIN HCL 150 MG PO CAPS: 150.0000 mg | ORAL_CAPSULE | Freq: Three times a day (TID) | ORAL | 0 refills | Status: AC

## 2021-11-07 MED ORDER — LIDOCAINE VISCOUS HCL 2 % MT SOLN: 15.0000 mL | OROMUCOSAL | 0 refills | Status: AC | PRN

## 2021-11-07 MED ORDER — CHLORHEXIDINE GLUCONATE 0.12 % MT SOLN: 15.0000 mL | Freq: Two times a day (BID) | OROMUCOSAL | 0 refills | Status: AC

## 2021-11-07 NOTE — ED Provider Notes (Signed)
[facility] DEPT Provider Note   CSN: [id number] Arrival date & time: 11/07/21  1840     History Chief Complaint  Patient presents with   Dental Pain    Cheryl Green is a 30 y.o. female presents emergency department with a chief complaint of dental pain and left ear pain.  Patient reports dental pain has been present over the last 5 days.  Pain has been constant.  Patient rates pain 10/10 on pain scale.  Pain is worse with touch and p.o. intake.  Patient has had no relief with over-the-counter pain medication or Orajel.  Patient states that pain radiates to her left ear.  Patient describes left ear pain as "throbbing and sharp."  Patient denies any dental trauma.  Denies any fevers, chills, neck pain, neck stiffness, drooling, trouble swallowing, high potato voice, trismus, otorrhea.  Patient denies any illicit drug use or alcohol use.   Dental Pain Associated symptoms: no drooling, no facial swelling, no fever, no headaches and no neck pain       Past Medical History:  Diagnosis Date   ADHD (attention deficit hyperactivity disorder)    Anxiety attack    Scoliosis     There are no problems to display for this patient.   History reviewed. No pertinent surgical history.   OB History   No obstetric history on file.     History reviewed. No pertinent family history.  Social History   Tobacco Use   Smoking status: Former   Smokeless tobacco: Never  Scientific laboratory technician Use: Never used  Substance Use Topics   Alcohol use: Not Currently   Drug use: No    Home Medications Prior to Admission medications   Medication Sig Start Date End Date Taking? Authorizing Provider  acetaminophen (TYLENOL) 325 MG tablet Take 650 mg by mouth every 6 (six) hours as needed for mild pain, fever or headache.    [provider]  escitalopram (LEXAPRO) 10 MG tablet Take 30 mg by mouth daily.    [provider]  hydrOXYzine  (ATARAX/VISTARIL) 50 MG tablet Take 50 mg by mouth in the morning and at bedtime.    [provider]  loperamide (IMODIUM A-D) 2 MG tablet Take 4 mg by mouth 4 (four) times daily as needed for diarrhea or loose stools.    [provider]  meclizine (ANTIVERT) 25 MG tablet Take 25 mg by mouth 3 (three) times daily as needed for dizziness.    [provider]  Potassium Chloride ER 20 MEQ TBCR Take 20 mEq by mouth daily for 7 days. 12/15/20 12/22/20  Lucrezia Starch, MD    Allergies    Amoxicillin  Review of Systems   Review of Systems  Constitutional:  Negative for chills and fever.  HENT:  Positive for dental problem and ear pain. Negative for drooling, ear discharge, facial swelling, sore throat, trouble swallowing and voice change.   Eyes:  Negative for visual disturbance.  Respiratory:  Negative for shortness of breath.   Cardiovascular:  Negative for chest pain.  Gastrointestinal:  Negative for abdominal pain, nausea and vomiting.  Musculoskeletal:  Negative for back pain, neck pain and neck stiffness.  Skin:  Negative for color change and rash.  Neurological:  Negative for dizziness, syncope, light-headedness and headaches.  Psychiatric/Behavioral:  Negative for confusion.    Physical Exam Updated Vital Signs BP (!) 98/56 (BP Location: Left Arm)   Pulse 71   Temp (!) 97.5  F (36.4 C) (Oral)   Resp 18   SpO2 99%   Physical Exam Vitals and nursing note reviewed.  Constitutional:      General: She is not in acute distress.    Appearance: She is not ill-appearing, toxic-appearing or diaphoretic.     Comments: Appears uncomfortable due to complaints of dental pain  HENT:     Head: Normocephalic.     Comments: No facial swelling    Right Ear: Tympanic membrane, ear canal and external ear normal. No mastoid tenderness.     Left Ear: Ear canal and external ear normal. A middle ear effusion is present. No mastoid tenderness.     Ears:     Comments: No  auricle proptosis bilaterally    Mouth/Throat:     Lips: Pink. No lesions.     Mouth: Mucous membranes are moist.     Dentition: Dental tenderness and dental caries present. No dental abscesses.     Tongue: No lesions. Tongue does not deviate from midline.     Palate: No mass and lesions.     Pharynx: Oropharynx is clear. Uvula midline. No pharyngeal swelling, oropharyngeal exudate, posterior oropharyngeal erythema or uvula swelling.     Tonsils: No tonsillar exudate or tonsillar abscesses. 1+ on the right. 1+ on the left.      Comments: Dental tenderness to tooth noted above.  Dental cavity noted to tooth above.  Handles oral secretions without difficulty Eyes:     General: No scleral icterus.       Right eye: No discharge.        Left eye: No discharge.  Neck:     Comments: No swelling to submandibular space Cardiovascular:     Rate and Rhythm: Normal rate.  Pulmonary:     Effort: Pulmonary effort is normal. No tachypnea, bradypnea or respiratory distress.     Comments: Patient full sentences without difficulty Musculoskeletal:     Cervical back: Full passive range of motion without pain, normal range of motion and neck supple. No edema, erythema, signs of trauma, rigidity, torticollis or crepitus. No pain with movement. Normal range of motion.  Lymphadenopathy:     Cervical: Cervical adenopathy present.     Left cervical: Superficial cervical adenopathy present.  Skin:    General: Skin is warm and dry.  Neurological:     General: No focal deficit present.     Mental Status: She is alert.  Psychiatric:        Behavior: Behavior is cooperative.    ED Results / Procedures / Treatments   Labs (all labs ordered are listed, but only abnormal results are displayed) Labs Reviewed - No data to display  EKG None  Radiology No results found.  Procedures Procedures   Medications Ordered in ED Medications  lidocaine (XYLOCAINE) 2 % viscous mouth solution 15 mL (has no  administration in time range)    ED Course  I have reviewed the triage vital signs and the nursing notes.  Pertinent labs & imaging results that were available during my care of the patient were reviewed by me and considered in my medical decision making (see chart for details).    MDM Rules/Calculators/A&P                           Alert 30 year old female no acute distress, nontoxic appearing.  Patient appears uncomfortable due to complaints of dental pain.  States pain has been present over the  last 5 days.  Denies any dental trauma.  On exam patient has dental carry and dental [place] tenderness.  No dental abscess.  Patient able to handle oral secretions without difficulty.  No facial swelling or swelling to submandibular space.  Full passive range of motion to neck without pain.  Will prescribe patient with clindamycin as he has allergy to amoxicillin.  Additionally will prescribe Peridex, viscous lidocaine, and short course of Percocet.  Patient given information to follow-up with oral surgery/dentist in outpatient setting.  Discussed results, findings, treatment and follow up. Patient advised of return precautions. Patient verbalized understanding and agreed with plan.   Final Clinical Impression(s) / ED Diagnoses Final diagnoses:  None    Rx / DC Orders ED Discharge Orders          Ordered    oxyCODONE-acetaminophen (PERCOCET/ROXICET) 5-325 MG tablet  Every 8 hours PRN        11/07/21 2005    clindamycin (CLEOCIN) 150 MG capsule  3 times daily        11/07/21 2005    chlorhexidine (PERIDEX) 0.12 % solution  2 times daily        11/07/21 2005    lidocaine (XYLOCAINE) 2 % solution  Every 4 hours PRN        11/07/21 2005             Berneice Heinrich 11/07/21 2010    Gwyneth Sprout, MD 11/07/21 2251

## 2021-11-07 NOTE — Discharge Instructions (Addendum)
You came to the emergency department today to be evaluated for your dental pain.  There is concern for a dental infection.  Due to this she was started on antibiotic clindamycin.  Please take 1 pill 3 times a day for the next 7 days.  I have also given you prescription for viscous lidocaine.  You may swish this around your mouth and spit it out as often as needed to help with your dental pain.  If you swallow this medication you may only use it every 4 hours.  You may have diarrhea from the antibiotics.  It is very important that you continue to take the antibiotics even if you get diarrhea unless a medical professional tells you that you may stop taking them.  If you stop too early the bacteria you are being treated for will become stronger and you may need different, more powerful antibiotics that have more side effects and worsening diarrhea.  Please stay well hydrated and consider probiotics as they may decrease the severity of your diarrhea.  Please be aware that if you take any hormonal contraception (birth control pills, nexplanon, the ring, etc) that your birth control will not work while you are taking antibiotics and you need to use back up protection as directed on the birth control medication information insert.    Get help right away if: You are unable to open your mouth. You are having trouble breathing or swallowing. You have a fever. You notice that your face, neck, or jaw is swollen.

## 2021-11-07 NOTE — ED Triage Notes (Signed)
Pt reports with dental pain to the left side of her mouth x 1 week. Pt states that her left ear is hurting now and reports that she has an appt for the tooth to be removed next week.

## 2022-02-23 ENCOUNTER — Other Ambulatory Visit: Payer: Self-pay

## 2022-02-23 ENCOUNTER — Emergency Department (HOSPITAL_BASED_OUTPATIENT_CLINIC_OR_DEPARTMENT_OTHER)
Admission: EM | Admit: 2022-02-23 | Discharge: 2022-02-23 | Disposition: A | Discharge status: Home / Self Care | Payer: Medicaid Other | Attending: Emergency Medicine | Admitting: Emergency Medicine

## 2022-02-23 ENCOUNTER — Encounter (HOSPITAL_BASED_OUTPATIENT_CLINIC_OR_DEPARTMENT_OTHER): Payer: Self-pay

## 2022-02-23 DIAGNOSIS — N9089 Other specified noninflammatory disorders of vulva and perineum: Secondary | ICD-10-CM | POA: Diagnosis present

## 2022-02-23 MED ORDER — DOXYCYCLINE HYCLATE 100 MG PO CAPS: 100.0000 mg | ORAL_CAPSULE | Freq: Two times a day (BID) | ORAL | 0 refills | Status: AC

## 2022-02-23 MED ORDER — DOXYCYCLINE HYCLATE 100 MG PO TABS
100.0000 mg | ORAL_TABLET | Freq: Once | ORAL | Status: AC
  Administered 2022-02-23: 100 mg via ORAL
  Filled 2022-02-23: qty 1

## 2022-02-23 NOTE — ED Provider Notes (Signed)
?[facility] EMERGENCY DEPARTMENT ?Provider Note ? ? ?CSN: [id number] ?Arrival date & time: 02/23/22  1902 ? ?  ?History ? ?Chief Complaint  ?Patient presents with  ? Cyst  ? ? ?Cheryl Green is a 31 y.o. female history of chronic opiate use on Suboxone here for evaluation of lesion to right labia.  Began 1 week ago.  Noted after shaving.  Warm compress, lesion is painful.  Does not extend into labia minora.  Vaginal discharge, pelvic pain, concern for STD.  Tetanus.  No history of Bartholin cyst, abscess.  No prior lesions requiring drainage.  No history of HPV. No meds PTA. No fever, emesis, abd pain, urinary complaints ? ? ? ? ? ?HPI ? ?  ? ?Home Medications ?Prior to Admission medications   ?Medication Sig Start Date End Date Taking? Authorizing Provider  ?doxycycline (VIBRAMYCIN) 100 MG capsule Take 1 capsule (100 mg total) by mouth 2 (two) times daily. 02/23/22  Yes Mahlik Lenn A, PA-C  ?acetaminophen (TYLENOL) 325 MG tablet Take 650 mg by mouth every 6 (six) hours as needed for mild pain, fever or headache.    [provider]  ?chlorhexidine (PERIDEX) 0.12 % solution Use as directed 15 mLs in the mouth or throat 2 (two) times daily. 11/07/21   Haskel SchroederBadalamente, Peter R, PA-C  ?escitalopram (LEXAPRO) 10 MG tablet Take 30 mg by mouth daily.    [provider]  ?hydrOXYzine (ATARAX/VISTARIL) 50 MG tablet Take 50 mg by mouth in the morning and at bedtime.    [provider]  ?lidocaine (XYLOCAINE) 2 % solution Use as directed 15 mLs in the mouth or throat every 4 (four) hours as needed for mouth pain. 11/07/21   Haskel SchroederBadalamente, Peter R, PA-C  ?loperamide (IMODIUM A-D) 2 MG tablet Take 4 mg by mouth 4 (four) times daily as needed for diarrhea or loose stools.    [provider]  ?meclizine (ANTIVERT) 25 MG tablet Take 25 mg by mouth 3 (three) times daily as needed for dizziness.    [provider]  ?Potassium Chloride ER 20 MEQ TBCR Take 20 mEq by mouth daily for  7 days. 12/15/20 12/22/20  Milagros Lollykstra, Richard S, MD  ?   ? ?Allergies    ?Amoxicillin   ? ?Review of Systems   ?Review of Systems  ?Constitutional: Negative.   ?HENT: Negative.    ?Respiratory: Negative.    ?Cardiovascular: Negative.   ?Gastrointestinal: Negative.   ?Genitourinary:  Positive for genital sores.  ?Musculoskeletal: Negative.   ?Skin: Negative.   ?Neurological: Negative.   ?All other systems reviewed and are negative. ? ?Physical Exam ?Updated Vital Signs ?BP 114/79 (BP Location: Left Arm)   Pulse (!) 102   Temp 98.2 ?F (36.8 ?C) (Oral)   Resp 17   Ht 5\' 8"  (1.727 m)   Wt 63.5 kg   LMP 02/08/2022   SpO2 100%   BMI 21.29 kg/m?  ?Physical Exam ?Vitals and nursing note reviewed. Exam conducted with a chaperone present.  ?Constitutional:   ?   General: She is not in acute distress. ?   Appearance: She is well-developed. She is not ill-appearing, toxic-appearing or diaphoretic.  ?HENT:  ?   Head: Normocephalic and atraumatic.  ?Eyes:  ?   Pupils: Pupils are equal, round, and reactive to light.  ?Cardiovascular:  ?   Rate and Rhythm: Normal rate.  ?Pulmonary:  ?   Effort: No respiratory distress.  ?Abdominal:  ?   General: Bowel sounds are normal.  There is no distension.  ?   Palpations: Abdomen is soft.  ?   Tenderness: There is no abdominal tenderness. There is no right CVA tenderness, left CVA tenderness, guarding or rebound.  ?   Hernia: No hernia is present.  ?Genitourinary: ?   Pubic Area: No rash or pubic lice.   ?   Labia:     ?   Right: Tenderness present. No rash, lesion or injury.     ?   Left: No rash, tenderness, lesion or injury.   ?   Urethra: No prolapse, urethral pain, urethral swelling or urethral lesion.  ? ? ?   Comments: Chaperone present in room. 21mm area or induration to right superior aspect labia majora.  No fluctuance. No vesicles. No erythema, warmth.  No Bartholin abscess or cyst. No large drainable abscess.  ?Musculoskeletal:     ?   General: Normal range of motion.  ?    Cervical back: Normal range of motion.  ?Skin: ?   General: Skin is warm and dry.  ?Neurological:  ?   General: No focal deficit present.  ?   Mental Status: She is alert.  ?Psychiatric:     ?   Mood and Affect: Mood normal.  ? ? ?ED Results / Procedures / Treatments   ?Labs ?(all labs ordered are listed, but only abnormal results are displayed) ?Labs Reviewed - No data to display ? ?EKG ?None ? ?Radiology ?No results found. ? ?Procedures ?Procedures  ? ? ?Medications Ordered in ED ?Medications  ?doxycycline (VIBRA-TABS) tablet 100 mg (has no administration in time range)  ? ? ?ED Course/ Medical Decision Making/ A&P ?  ? ?30 year here for evaluation of right labial painful area which began 1 week ago.  No systemic symptoms.  Does shave her GU region.  On exam she has 7 mm area of induration without fluctuance to the superior right aspect labia majora.  She has no large drainable abscess.  No vesicles to suggest HPV.  She has no Bartholin's abscess or cyst on exam.  Abdomen soft, nontender.  No pelvic pain, vaginal discharge or concern for STD. ? ?Area today is not large enough for drainage.  I discussed warm compress, sitz bath's, antibiotics, close follow-up with OB/GYN, return if area enlarges to see if drainable at that time.  She is agreeable. ? ?The patient has been appropriately medically screened and/or stabilized in the ED. I have low suspicion for any other emergent medical condition which would require further screening, evaluation or treatment in the ED or require inpatient management. ? ?Patient is hemodynamically stable and in no acute distress.  Patient able to ambulate in department prior to ED.  Evaluation does not show acute pathology that would require ongoing or additional emergent interventions while in the emergency department or further inpatient treatment.  I have discussed the diagnosis with the patient and answered all questions.  Pain is been managed while in the emergency department and  patient has no further complaints prior to discharge.  Patient is comfortable with plan discussed in room and is stable for discharge at this time.  I have discussed strict return precautions for returning to the emergency department.  Patient was encouraged to follow-up with PCP/specialist refer to at discharge.  ? ?                        ?Medical Decision Making ?Amount and/or Complexity of Data Reviewed ?External Data Reviewed: labs  and notes. ? ?Risk ?OTC drugs. ?Prescription drug management. ? ? ? ? ? ? ? ? ? ?Final Clinical Impression(s) / ED Diagnoses ?Final diagnoses:  ?Labial lesion  ? ? ?Rx / DC Orders ?ED Discharge Orders   ? ?      Ordered  ?  doxycycline (VIBRAMYCIN) 100 MG capsule  2 times daily       ? 02/23/22 2034  ? ?  ?  ? ?  ? ? ?  ?Crystalann Korf A, PA-C ?02/23/22 2034 ? ?  ?Arby Barrette, MD ?02/23/22 2156 ? ?

## 2022-02-23 NOTE — Discharge Instructions (Signed)
Use warm compress, sitz bath's for area.  Take antibiotics as prescribed.  May also use Tylenol Motrin as needed for pain. ?

## 2022-02-23 NOTE — ED Triage Notes (Signed)
Pt c/o "cyst" x 2 to vagina x 1 week-NAD-steady gait ?

## 2022-04-16 ENCOUNTER — Emergency Department (HOSPITAL_COMMUNITY)
Admission: EM | Admit: 2022-04-16 | Discharge: 2022-04-16 | Disposition: A | Discharge status: Home / Self Care | Payer: Medicaid Other | Attending: Emergency Medicine | Admitting: Emergency Medicine

## 2022-04-16 ENCOUNTER — Encounter (HOSPITAL_COMMUNITY): Payer: Self-pay

## 2022-04-16 ENCOUNTER — Emergency Department (HOSPITAL_COMMUNITY): Payer: Medicaid Other

## 2022-04-16 DIAGNOSIS — H9202 Otalgia, left ear: Secondary | ICD-10-CM | POA: Diagnosis present

## 2022-04-16 DIAGNOSIS — H66012 Acute suppurative otitis media with spontaneous rupture of ear drum, left ear: Secondary | ICD-10-CM | POA: Insufficient documentation

## 2022-04-16 DIAGNOSIS — H66002 Acute suppurative otitis media without spontaneous rupture of ear drum, left ear: Secondary | ICD-10-CM

## 2022-04-16 DIAGNOSIS — M542 Cervicalgia: Secondary | ICD-10-CM | POA: Diagnosis not present

## 2022-04-16 DIAGNOSIS — Z87891 Personal history of nicotine dependence: Secondary | ICD-10-CM | POA: Insufficient documentation

## 2022-04-16 LAB — I-STAT CHEM 8, ED
BUN: 14 mg/dL (ref 6–20)
Calcium, Ion: 1.21 mmol/L (ref 1.15–1.40)
Chloride: 103 mmol/L (ref 98–111)
Creatinine, Ser: 0.7 mg/dL (ref 0.44–1.00)
Glucose, Bld: 66 mg/dL — ABNORMAL LOW (ref 70–99)
HCT: 41 % (ref 36.0–46.0)
Hemoglobin: 13.9 g/dL (ref 12.0–15.0)
Potassium: 3.9 mmol/L (ref 3.5–5.1)
Sodium: 141 mmol/L (ref 135–145)
TCO2: 27 mmol/L (ref 22–32)

## 2022-04-16 MED ORDER — CYCLOBENZAPRINE HCL 10 MG PO TABS: 10.0000 mg | ORAL_TABLET | Freq: Two times a day (BID) | ORAL | 0 refills | Status: AC | PRN

## 2022-04-16 MED ORDER — CEFDINIR 300 MG PO CAPS: 300.0000 mg | ORAL_CAPSULE | Freq: Two times a day (BID) | ORAL | 0 refills | Status: AC

## 2022-04-16 MED ORDER — KETOROLAC TROMETHAMINE 15 MG/ML IJ SOLN
15.0000 mg | Freq: Once | INTRAMUSCULAR | Status: DC
Start: 2022-04-16 — End: 2022-04-16
  Filled 2022-04-16: qty 1

## 2022-04-16 MED ORDER — CYCLOBENZAPRINE HCL 10 MG PO TABS
5.0000 mg | ORAL_TABLET | Freq: Once | ORAL | Status: AC
  Administered 2022-04-16: 5 mg via ORAL
  Filled 2022-04-16: qty 1

## 2022-04-16 MED ORDER — KETOROLAC TROMETHAMINE 30 MG/ML IJ SOLN
15.0000 mg | Freq: Once | INTRAMUSCULAR | Status: AC
Start: 2022-04-16 — End: 2022-04-16
  Administered 2022-04-16: 15 mg via INTRAVENOUS
  Filled 2022-04-16: qty 1

## 2022-04-16 MED ORDER — KETOROLAC TROMETHAMINE 15 MG/ML IJ SOLN
15.0000 mg | Freq: Once | INTRAMUSCULAR | Status: DC
Start: 2022-04-16 — End: 2022-04-16

## 2022-04-16 MED ORDER — IOHEXOL 300 MG/ML  SOLN
75.0000 mL | Freq: Once | INTRAMUSCULAR | Status: AC | PRN
  Administered 2022-04-16: 75 mL via INTRAVENOUS

## 2022-04-16 MED ORDER — KETOROLAC TROMETHAMINE 15 MG/ML IJ SOLN
15.0000 mg | Freq: Once | INTRAMUSCULAR | Status: AC
  Administered 2022-04-16: 15 mg via INTRAMUSCULAR

## 2022-04-16 NOTE — ED Provider Notes (Signed)
?[facility]-EMERGENCY DEPT ?Provider Note ? ? ?CSN: [id number] ?Arrival date & time: 04/16/22  0915 ? ?  ? ?History ? ?Chief Complaint  ?Patient presents with  ? Otalgia  ? ? ?Cheryl Green is a 31 y.o. female who presents to the ED for evaluation of left-sided otalgia x1 week and worsening significantly in the last 3 to 4 days.  Patient also endorses congestion and pain radiating from her left temple all the way down to her left neck.  She is concerned as she woke up this morning and saw blood on her pillow.  She also endorses problems with balance and equilibrium due to the pressure in her ear.  Patient has attempted Motrin at home without significant improvement.  She denies fever, chills, abdominal pain, shortness of breath, cough, nausea, vomiting and diarrhea. ? ? ?Otalgia ? ?  ? ?Home Medications ?Prior to Admission medications   ?Medication Sig Start Date End Date Taking? Authorizing Provider  ?cefdinir (OMNICEF) 300 MG capsule Take 1 capsule (300 mg total) by mouth 2 (two) times daily. 04/16/22  Yes Raynald Blend R, PA-C  ?cyclobenzaprine (FLEXERIL) 10 MG tablet Take 1 tablet (10 mg total) by mouth 2 (two) times daily as needed for muscle spasms. 04/16/22  Yes Raynald Blend R, PA-C  ?acetaminophen (TYLENOL) 325 MG tablet Take 650 mg by mouth every 6 (six) hours as needed for mild pain, fever or headache.    [provider]  ?chlorhexidine (PERIDEX) 0.12 % solution Use as directed 15 mLs in the mouth or throat 2 (two) times daily. 11/07/21   Haskel Schroeder, PA-C  ?doxycycline (VIBRAMYCIN) 100 MG capsule Take 1 capsule (100 mg total) by mouth 2 (two) times daily. 02/23/22   Henderly, Britni A, PA-C  ?escitalopram (LEXAPRO) 10 MG tablet Take 30 mg by mouth daily.    [provider]  ?hydrOXYzine (ATARAX/VISTARIL) 50 MG tablet Take 50 mg by mouth in the morning and at bedtime.    [provider]  ?lidocaine (XYLOCAINE) 2 % solution Use as directed 15 mLs in the  mouth or throat every 4 (four) hours as needed for mouth pain. 11/07/21   Haskel Schroeder, PA-C  ?loperamide (IMODIUM A-D) 2 MG tablet Take 4 mg by mouth 4 (four) times daily as needed for diarrhea or loose stools.    [provider]  ?meclizine (ANTIVERT) 25 MG tablet Take 25 mg by mouth 3 (three) times daily as needed for dizziness.    [provider]  ?Potassium Chloride ER 20 MEQ TBCR Take 20 mEq by mouth daily for 7 days. 12/15/20 12/22/20  Milagros Loll, MD  ?   ? ?Allergies    ?Amoxicillin   ? ?Review of Systems   ?Review of Systems  ?HENT:  Positive for ear pain.   ? ?Physical Exam ?Updated Vital Signs ?BP (!) 111/54 (BP Location: Right Arm)   Pulse (!) 51   Temp 97.6 ?F (36.4 ?C) (Oral)   Resp 18   LMP 03/26/2022 (Approximate)   SpO2 100%  ?Physical Exam ?Vitals and nursing note reviewed.  ?Constitutional:   ?   General: She is not in acute distress. ?   Appearance: She is not ill-appearing.  ?HENT:  ?   Head: Atraumatic.  ? ?   Right Ear: Tympanic membrane normal.  ?   Ears:  ?   Comments: Left tympanic membrane, erythematous, bulging.  Positive left-sided mastoid tenderness.  EAC intact.  ? ? Right TM and  EAC without acute findings. ?Eyes:  ?   Conjunctiva/sclera: Conjunctivae normal.  ?Neck:  ?   Meningeal: Brudzinski's sign and Kernig's sign absent.  ? ?   Comments: C-spine nontender to palpation at midline, normal range of motion in all directions.   ? ? ? ?Cardiovascular:  ?   Rate and Rhythm: Normal rate and regular rhythm.  ?   Pulses: Normal pulses.  ?   Heart sounds: No murmur heard. ?Pulmonary:  ?   Effort: Pulmonary effort is normal. No respiratory distress.  ?   Breath sounds: Normal breath sounds.  ?Abdominal:  ?   General: Abdomen is flat. There is no distension.  ?   Palpations: Abdomen is soft.  ?   Tenderness: There is no abdominal tenderness.  ?Musculoskeletal:     ?   General: Normal range of motion.  ?   Cervical back: Normal range of motion.  ?Skin: ?    General: Skin is warm and dry.  ?   Capillary Refill: Capillary refill takes less than 2 seconds.  ?Neurological:  ?   General: No focal deficit present.  ?   Mental Status: She is alert.  ?Psychiatric:     ?   Mood and Affect: Mood normal.  ? ? ?ED Results / Procedures / Treatments   ?Labs ?(all labs ordered are listed, but only abnormal results are displayed) ?Labs Reviewed  ?I-STAT CHEM 8, ED - Abnormal; Notable for the following components:  ?    Result Value  ? Glucose, Bld 66 (*)   ? All other components within normal limits  ? ? ?EKG ?None ? ?Radiology ?No results found. ? ?Procedures ?Procedures  ? ? ?Medications Ordered in ED ?Medications  ?cyclobenzaprine (FLEXERIL) tablet 5 mg (5 mg Oral Given 04/16/22 1114)  ?ketorolac (TORADOL) 15 MG/ML injection 15 mg (15 mg Intramuscular Given 04/16/22 1203)  ?iohexol (OMNIPAQUE) 300 MG/ML solution 75 mL (75 mLs Intravenous Contrast Given 04/16/22 1431)  ? ? ?ED Course/ Medical Decision Making/ A&P ?  ?                        ?Medical Decision Making ?Amount and/or Complexity of Data Reviewed ?Radiology: ordered. ? ?Risk ?Prescription drug management. ? ? ?History:  ?Per HPI ?Social determinants of health:  ?Social History  ? ?Socioeconomic History  ? Marital status: Married  ?  Spouse name: Not on file  ? Number of children: Not on file  ? Years of education: Not on file  ? Highest education level: Not on file  ?Occupational History  ? Not on file  ?Tobacco Use  ? Smoking status: Former  ? Smokeless tobacco: Never  ?Vaping Use  ? Vaping Use: Never used  ?Substance and Sexual Activity  ? Alcohol use: Not Currently  ? Drug use: No  ? Sexual activity: Not on file  ?Other Topics Concern  ? Not on file  ?Social History Narrative  ? Not on file  ? ?Social Determinants of Health  ? ?Financial Resource Strain: Not on file  ?Food Insecurity: Not on file  ?Transportation Needs: Not on file  ?Physical Activity: Not on file  ?Stress: Not on file  ?Social Connections: Not on file   ?Intimate Partner Violence: Not on file  ? ? ? ?Initial impression: ? ?This patient presents to the ED for concern of left ear pain and neck pain, this involves an extensive number of treatment options, and is a complaint that carries with  it a high risk of complications and morbidity.   Differentials include otitis externa, otitis media, mastoiditis ? ? ?Lab Tests and EKG: ? ?I Ordered, reviewed, and interpreted labs and EKG.  The pertinent results include:  ?I-STAT CHEM without acute findings ? ? ?Imaging Studies ordered: ? ?I ordered imaging studies including  ?CT of head and temporal bones with contrast which shows no evidence of mastoid infection ?I independently visualized and interpreted imaging and I agree with the radiologist interpretation.  ? ? ? ?Medicines ordered and prescription drug management: ? ?I ordered medication including: ?Toradol 30 mg IM ?Flexeril 5 mg p.o. ?Reevaluation of the patient after these medicines showed that the patient improved ?I have reviewed the patients home medicines and have made adjustments as needed ? ? ?ED Course: ?31 year old female in no acute distress, nontoxic-appearing presents to the ED for evaluation of left ear pain.  Vitals without significant abnormalities.  She is afebrile.  Left tympanic membrane is erythematous, bulging.  EAC intact.  Of concern, notable for mastoid tenderness of left side.  I ordered CT head and temporal bones with contrast, however imaging was delayed secondary to vascular access difficulties.  Patient required IV guided ultrasound for blood draw.  I-STAT Chem-8 without acute findings.  CT head and temporal bones without acute findings or evidence of mastoiditis.  Patient's symptoms are likely due to discomfort from acute otitis media as well as muscular tightness around the neck. Pt has amoxicillin allergy so prescribed cefdinir 300 mg BID x7 days. Flexeril provided. Pt expresses understanding and is amenable to  plan ? ?Disposition: ? ?After consideration of the diagnostic results, physical exam, history and the patients response to treatment feel that the patent would benefit from discharge.   ?Acute otitis media of left ear ?Neck pain :  Plan

## 2022-04-16 NOTE — ED Notes (Signed)
Unsuccessful IV attempt in R & L AC. 22g. ?

## 2022-04-16 NOTE — ED Notes (Signed)
I provided reinforced discharge education based off of discharge instructions. Pt acknowledged and understood my education. Pt had no further questions/concerns for provider/myself.  °

## 2022-04-16 NOTE — Discharge Instructions (Addendum)
You have an ear infection within  your left ear, which is causing your pain. I have sent you in an antibiotic twice daily for 7 days.  Additionally, I recommend that you take Motrin or Tylenol for your pain and discomfort.  Since you have this pain rating down your neck, I have also sent you in a prescription for a muscle relaxer.  Please avoid taking this prior to driving or going to work as it can make you drowsy.  Follow-up with your primary care doctor if your symptoms do not improve. ?

## 2022-04-16 NOTE — ED Notes (Signed)
Dr.Schlossman stated she will attempt [place] IV shortly. ?

## 2022-04-16 NOTE — ED Notes (Signed)
Unsuccessful IV attempt by Sherrilyn Rist RN ?

## 2022-04-16 NOTE — ED Notes (Signed)
Will attempt ultrasound IV. ?

## 2022-04-16 NOTE — ED Notes (Signed)
IV team aware of consult per secretary, I have provided pt with this information. Pt currently has no questions or needs at this time. ?

## 2022-04-16 NOTE — ED Triage Notes (Signed)
Pt presents with c/o left ear pain for several weeks. Pt reports she noticed blood on her pillow when she woke up draining from that ear.  ?

## 2022-06-07 ENCOUNTER — Emergency Department (HOSPITAL_COMMUNITY): Payer: Medicaid Other

## 2022-06-07 ENCOUNTER — Encounter (HOSPITAL_COMMUNITY): Payer: Self-pay | Admitting: Student

## 2022-06-07 ENCOUNTER — Other Ambulatory Visit: Payer: Self-pay

## 2022-06-07 ENCOUNTER — Emergency Department (HOSPITAL_COMMUNITY)
Admission: EM | Admit: 2022-06-07 | Discharge: 2022-06-08 | Disposition: A | Discharge status: Home / Self Care | Payer: Medicaid Other | Attending: Emergency Medicine | Admitting: Emergency Medicine

## 2022-06-07 DIAGNOSIS — R42 Dizziness and giddiness: Secondary | ICD-10-CM | POA: Insufficient documentation

## 2022-06-07 DIAGNOSIS — R102 Pelvic and perineal pain: Secondary | ICD-10-CM | POA: Diagnosis present

## 2022-06-07 DIAGNOSIS — N83202 Unspecified ovarian cyst, left side: Secondary | ICD-10-CM | POA: Diagnosis not present

## 2022-06-07 LAB — URINALYSIS, ROUTINE W REFLEX MICROSCOPIC
Bacteria, UA: NONE SEEN
Bilirubin Urine: NEGATIVE
Glucose, UA: NEGATIVE mg/dL
Hgb urine dipstick: NEGATIVE
Ketones, ur: NEGATIVE mg/dL
Nitrite: NEGATIVE
Protein, ur: NEGATIVE mg/dL
Specific Gravity, Urine: >1.046 — ABNORMAL HIGH (ref 1.005–1.030)
pH: 8 (ref 5.0–8.0)

## 2022-06-07 LAB — WET PREP, GENITAL
Clue Cells Wet Prep HPF POC: NONE SEEN
Sperm: NONE SEEN
Trich, Wet Prep: NONE SEEN
WBC, Wet Prep HPF POC: <10 (ref <10)
Yeast Wet Prep HPF POC: NONE SEEN

## 2022-06-07 LAB — I-STAT BETA HCG BLOOD, ED (MC, WL, AP ONLY): I-stat hCG, quantitative: <5 m[IU]/mL (ref <5)

## 2022-06-07 LAB — LIPASE, BLOOD: Lipase: 22 U/L (ref 11–51)

## 2022-06-07 LAB — CBC
HCT: 41.9 % (ref 36.0–46.0)
Hemoglobin: 13.9 g/dL (ref 12.0–15.0)
MCH: 30.2 pg (ref 26.0–34.0)
MCHC: 33.2 g/dL (ref 30.0–36.0)
MCV: 90.9 fL (ref 80.0–100.0)
Platelets: 267 10*3/uL (ref 150–400)
RBC: 4.61 MIL/uL (ref 3.87–5.11)
RDW: 12.4 % (ref 11.5–15.5)
WBC: 8.2 10*3/uL (ref 4.0–10.5)
nRBC: 0 % (ref 0.0–0.2)

## 2022-06-07 LAB — COMPREHENSIVE METABOLIC PANEL
ALT: 13 U/L (ref 0–44)
AST: 22 U/L (ref 15–41)
Albumin: 4.6 g/dL (ref 3.5–5.0)
Alkaline Phosphatase: 57 U/L (ref 38–126)
Anion gap: 10 (ref 5–15)
BUN: 12 mg/dL (ref 6–20)
CO2: 24 mmol/L (ref 22–32)
Calcium: 9.7 mg/dL (ref 8.9–10.3)
Chloride: 107 mmol/L (ref 98–111)
Creatinine, Ser: 0.88 mg/dL (ref 0.44–1.00)
GFR, Estimated: >60 mL/min (ref >60)
Glucose, Bld: 103 mg/dL — ABNORMAL HIGH (ref 70–99)
Potassium: 3.6 mmol/L (ref 3.5–5.1)
Sodium: 141 mmol/L (ref 135–145)
Total Bilirubin: 0.4 mg/dL (ref 0.3–1.2)
Total Protein: 7.9 g/dL (ref 6.5–8.1)

## 2022-06-07 MED ORDER — IOHEXOL 300 MG/ML  SOLN
100.0000 mL | Freq: Once | INTRAMUSCULAR | Status: AC | PRN
  Administered 2022-06-07: 100 mL via INTRAVENOUS

## 2022-06-07 MED ORDER — KETOROLAC TROMETHAMINE 15 MG/ML IJ SOLN
15.0000 mg | Freq: Once | INTRAMUSCULAR | Status: AC
Start: 2022-06-07 — End: 2022-06-07
  Administered 2022-06-07: 15 mg via INTRAVENOUS
  Filled 2022-06-07: qty 1

## 2022-06-07 MED ORDER — ONDANSETRON HCL 4 MG/2ML IJ SOLN
4.0000 mg | Freq: Once | INTRAMUSCULAR | Status: AC
  Administered 2022-06-07: 4 mg via INTRAVENOUS
  Filled 2022-06-07: qty 2

## 2022-06-07 MED ORDER — SODIUM CHLORIDE 0.9 % IV BOLUS
1000.0000 mL | Freq: Once | INTRAVENOUS | Status: AC
  Administered 2022-06-07: 1000 mL via INTRAVENOUS

## 2022-06-07 MED ORDER — HYDROMORPHONE HCL 1 MG/ML IJ SOLN
0.5000 mg | Freq: Once | INTRAMUSCULAR | Status: AC
  Administered 2022-06-07: 0.5 mg via INTRAMUSCULAR
  Filled 2022-06-07: qty 1

## 2022-06-07 NOTE — ED Provider Triage Note (Cosign Needed)
Emergency Medicine Provider Triage Evaluation Note  Cheryl Green , a 31 y.o. female  was evaluated in triage.  Pt complains of abd pain. Periumbilical pain now radiates to RLQ ongoing x 2 days.  Endorse n/v due to the pain. No fever, has intact appendix, LMP 1 wk ago. No dysuria, hematuria  Review of Systems  Positive: As above Negative: As above  Physical Exam  BP (!) 131/97 (BP Location: Left Arm)   Pulse 86   Temp 97.6 F (36.4 C) (Oral)   Ht 5\' 8"  (1.727 m)   Wt 67.1 kg   SpO2 100%   BMI 22.50 kg/m  Gen:   crying Resp:  Normal effort  MSK:   Moves extremities without difficulty  Other:  TTP RLQ with guarding  Medical Decision Making  Medically screening exam initiated at 8:14 PM.  Appropriate orders placed.  Marlissa B Finau was informed that the remainder of the evaluation will be completed by another provider, this initial triage assessment does not replace that evaluation, and the importance of remaining in the ED until their evaluation is complete.  Concerns for appendicitis   Laurence Compton, PA-C 06/07/22 2016

## 2022-06-07 NOTE — ED Provider Notes (Incomplete)
[facility]-EMERGENCY DEPT Provider Note   CSN: [id number] Arrival date & time: 06/07/22  1956     History {Add pertinent medical, surgical, social history, OB history to HPI:1} Chief Complaint  Patient presents with   Abdominal Pain    Cheryl Green is a 31 y.o. female with a hx of ADHD, anxiety, and scoliosis who presents to the ED with complaints of abdominal pain x 3 days. Patient reports pain is located in the right pelvic with radiation into her upper abdomen and back, intermittent. Earlier today she had an extremely severe episode of pain with associated nausea, 2 episodes of emesis, and lightheadedness as if she may pass out prompting ED visit. Pain alleviated some when she is in the fetal position, worse if she is stretched out. She has had some urinary urgency/frequency. Last BM yesterday was normal. Denies fever, hematemesis, diarrhea, melena, dysuria, vaginal bleeding or vaginal discharge. Sexually active w/ 1 partner, no specific concern for STI. Denies EtOH or drug use. Marland Kitchen   HPI     Home Medications Prior to Admission medications   Medication Sig Start Date End Date Taking? Authorizing Provider  acetaminophen (TYLENOL) 325 MG tablet Take 650 mg by mouth every 6 (six) hours as needed for mild pain, fever or headache.    [provider]  cefdinir (OMNICEF) 300 MG capsule Take 1 capsule (300 mg total) by mouth 2 (two) times daily. 04/16/22   Janell Quiet, PA-C  chlorhexidine (PERIDEX) 0.12 % solution Use as directed 15 mLs in the mouth or throat 2 (two) times daily. 11/07/21   Haskel Schroeder, PA-C  cyclobenzaprine (FLEXERIL) 10 MG tablet Take 1 tablet (10 mg total) by mouth 2 (two) times daily as needed for muscle spasms. 04/16/22   Janell Quiet, PA-C  doxycycline (VIBRAMYCIN) 100 MG capsule Take 1 capsule (100 mg total) by mouth 2 (two) times daily. 02/23/22   Henderly, Britni A, PA-C  escitalopram (LEXAPRO) 10 MG tablet Take 30 mg by  mouth daily.    [provider]  hydrOXYzine (ATARAX/VISTARIL) 50 MG tablet Take 50 mg by mouth in the morning and at bedtime.    [provider]  lidocaine (XYLOCAINE) 2 % solution Use as directed 15 mLs in the mouth or throat every 4 (four) hours as needed for mouth pain. 11/07/21   Haskel Schroeder, PA-C  loperamide (IMODIUM A-D) 2 MG tablet Take 4 mg by mouth 4 (four) times daily as needed for diarrhea or loose stools.    [provider]  meclizine (ANTIVERT) 25 MG tablet Take 25 mg by mouth 3 (three) times daily as needed for dizziness.    [provider]  Potassium Chloride ER 20 MEQ TBCR Take 20 mEq by mouth daily for 7 days. 12/15/20 12/22/20  Milagros Loll, MD      Allergies    Amoxicillin    Review of Systems   Review of Systems  Constitutional:  Negative for fever.  Respiratory:  Negative for shortness of breath.   Cardiovascular:  Negative for chest pain.  Gastrointestinal:  Positive for abdominal pain, nausea and vomiting. Negative for blood in stool, constipation and diarrhea.  Genitourinary:  Positive for frequency and urgency. Negative for dysuria, vaginal bleeding and vaginal discharge.  Neurological:  Negative for syncope.  All other systems reviewed and are negative.   Physical Exam Updated Vital Signs BP 107/65   Pulse 69   Temp 97.6 F (36.4 C) (Oral)  Resp 16   Ht 5\' 8"  (1.727 m)   Wt 67.1 kg   SpO2 99%   BMI 22.50 kg/m  Physical Exam Vitals and nursing note reviewed.  Constitutional:      General: She is not in acute distress.    Appearance: She is well-developed. She is not toxic-appearing.  HENT:     Head: Normocephalic and atraumatic.  Eyes:     General:        Right eye: No discharge.        Left eye: No discharge.     Conjunctiva/sclera: Conjunctivae normal.  Cardiovascular:     Rate and Rhythm: Normal rate and regular rhythm.  Pulmonary:     Effort: No respiratory distress.     Breath sounds:  Normal breath sounds. No wheezing or rales.  Abdominal:     General: There is no distension.     Palpations: Abdomen is soft.     Tenderness: There is abdominal tenderness in the right lower quadrant, epigastric area, periumbilical area, suprapubic area and left upper quadrant.  Genitourinary:    Comments: RN present as chaperone.  No external lesions Mild discharge present.  No purulence.  No CMT.  Bilateral adnexal tenderness R>L.  Musculoskeletal:     Cervical back: Neck supple.  Skin:    General: Skin is warm and dry.  Neurological:     Mental Status: She is alert.     Comments: Clear speech.   Psychiatric:        Behavior: Behavior normal.     ED Results / Procedures / Treatments   Labs (all labs ordered are listed, but only abnormal results are displayed) Labs Reviewed  COMPREHENSIVE METABOLIC PANEL - Abnormal; Notable for the following components:      Result Value   Glucose, Bld 103 (*)    All other components within normal limits  LIPASE, BLOOD  CBC  URINALYSIS, ROUTINE W REFLEX MICROSCOPIC  I-STAT BETA HCG BLOOD, ED ([facility], [facility], [facility] ONLY)    EKG None  Radiology CT ABDOMEN PELVIS W CONTRAST  Result Date: 06/07/2022 CLINICAL DATA:  Right lower quadrant pain. EXAM: CT ABDOMEN AND PELVIS WITH CONTRAST TECHNIQUE: Multidetector CT imaging of the abdomen and pelvis was performed using the standard protocol following bolus administration of intravenous contrast. RADIATION DOSE REDUCTION: This exam was performed according to the departmental dose-optimization program which includes automated exposure control, adjustment of the mA and/or kV according to patient size and/or use of iterative reconstruction technique. CONTRAST:  06/09/2022 OMNIPAQUE IOHEXOL 300 MG/ML  SOLN COMPARISON:  March 18, 2011 FINDINGS: Lower chest: No acute abnormality. Hepatobiliary: No focal liver abnormality is seen. No gallstones, gallbladder wall thickening, or biliary dilatation. Pancreas: Unremarkable. No  pancreatic ductal dilatation or surrounding inflammatory changes. Spleen: Normal in size without focal abnormality. Adrenals/Urinary Tract: Adrenal glands are unremarkable. Kidneys are normal, without renal calculi, focal lesion, or hydronephrosis. Bladder is unremarkable. Stomach/Bowel: Stomach is within normal limits. Appendix appears normal. A moderate amount retained stool is seen within the mid to distal sigmoid colon and rectum. No evidence of bowel wall thickening, distention, or inflammatory changes. Vascular/Lymphatic: No significant vascular findings are present. No enlarged abdominal or pelvic lymph nodes. Reproductive: The uterus is unremarkable. Small bilateral ovarian follicles are seen. Other: No abdominal wall hernia or abnormality. No abdominopelvic ascites. Musculoskeletal: No acute or significant osseous findings. IMPRESSION: No acute or active process within the abdomen or pelvis. Electronically Signed   By: March 20, 2011 M.D.   On:  06/07/2022 22:06    Procedures Procedures  {Document cardiac monitor, telemetry assessment procedure when appropriate:1}  Medications Ordered in ED Medications  HYDROmorphone (DILAUDID) injection 0.5 mg (0.5 mg Intramuscular Given 06/07/22 2041)  iohexol (OMNIPAQUE) 300 MG/ML solution 100 mL (100 mLs Intravenous Contrast Given 06/07/22 2148)    ED Course/ Medical Decision Making/ A&P                           Medical Decision Making Amount and/or Complexity of Data Reviewed Labs: ordered. Radiology: ordered.  Risk Prescription drug management.   ***  {Document critical care time when appropriate:1} {Document review of labs and clinical decision tools ie heart score, Chads2Vasc2 etc:1}  {Document your independent review of radiology images, and any outside records:1} {Document your discussion with family members, caretakers, and with consultants:1} {Document social determinants of health affecting pt's care:1} {Document your decision  making why or why not admission, treatments were needed:1} Final Clinical Impression(s) / ED Diagnoses Final diagnoses:  None    Rx / DC Orders ED Discharge Orders     None

## 2022-06-07 NOTE — ED Triage Notes (Signed)
Ambulatory to ED with c/o abd pain starting in epigastric region radiating down to RLQ x 2-3 days. Endorses N/V/D. Pt crying in triage.

## 2022-06-08 LAB — GC/CHLAMYDIA PROBE AMP (~~LOC~~) NOT AT ARMC
Chlamydia: NEGATIVE
Comment: NEGATIVE
Comment: NORMAL
Neisseria Gonorrhea: NEGATIVE

## 2022-06-08 MED ORDER — NAPROXEN 500 MG PO TBEC: 500.0000 mg | DELAYED_RELEASE_TABLET | Freq: Two times a day (BID) | ORAL | 0 refills | Status: AC | PRN

## 2022-06-08 MED ORDER — ONDANSETRON 4 MG PO TBDP: 4.0000 mg | ORAL_TABLET | Freq: Three times a day (TID) | ORAL | 0 refills | Status: AC | PRN

## 2022-06-09 LAB — URINE CULTURE: Culture: >=100000 — AB

## 2022-06-10 ENCOUNTER — Telehealth (HOSPITAL_BASED_OUTPATIENT_CLINIC_OR_DEPARTMENT_OTHER): Payer: Self-pay | Admitting: *Deleted

## 2022-06-18 ENCOUNTER — Telehealth: Payer: Self-pay | Admitting: Emergency Medicine

## 2023-12-15 IMAGING — CT CT ABD-PELV W/ CM
2 of 4 series · 17 of 46 positions shown, 19 images · IV contrast (agent unspecified)
Comparison: March 18, 2011

CLINICAL DATA: Right lower quadrant pain.

EXAM:
CT ABDOMEN AND PELVIS WITH CONTRAST
TECHNIQUE: Multidetector CT imaging of the abdomen and pelvis was performed
using the standard protocol following bolus administration of
intravenous contrast.

[Series 2: axial st · axial · 0.96mm/px · z∈[+1040,+1440]mm · 14 of 91 slices shown, 16 images]
[im 6/91  soft-tissue]
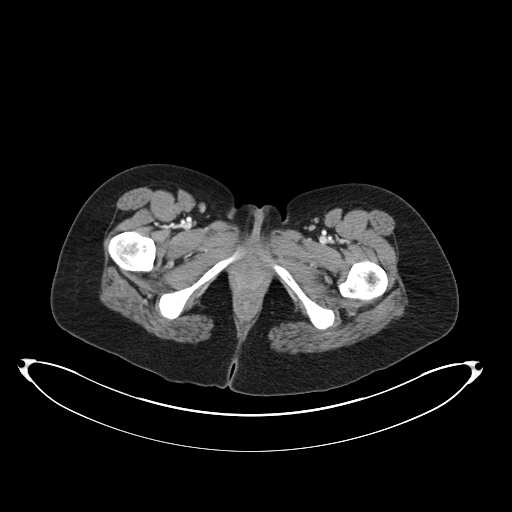
[im 6/91  bone]
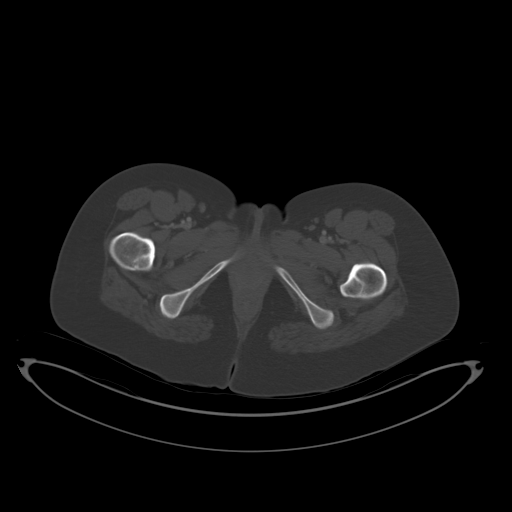
[im 11/91  soft-tissue]
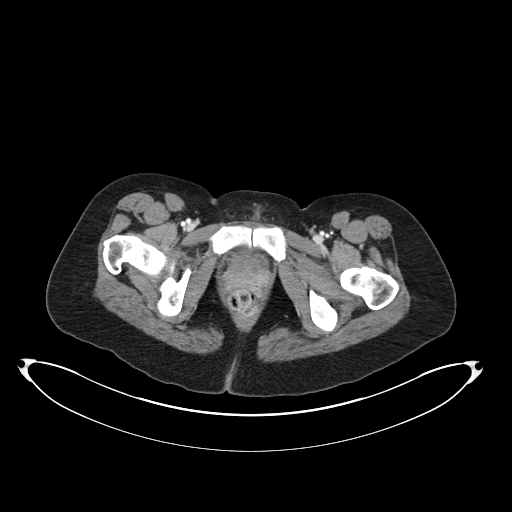
[im 21/91  soft-tissue]
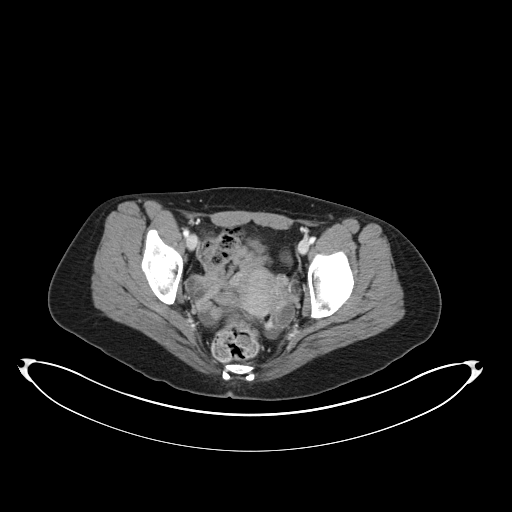
[im 26/91  soft-tissue]
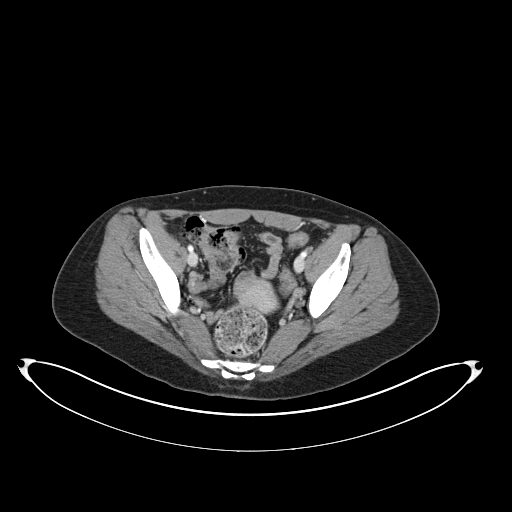
[im 31/91  soft-tissue]
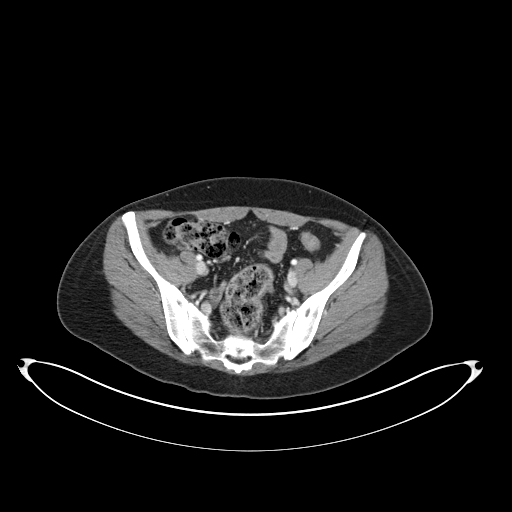
[im 36/91  soft-tissue]
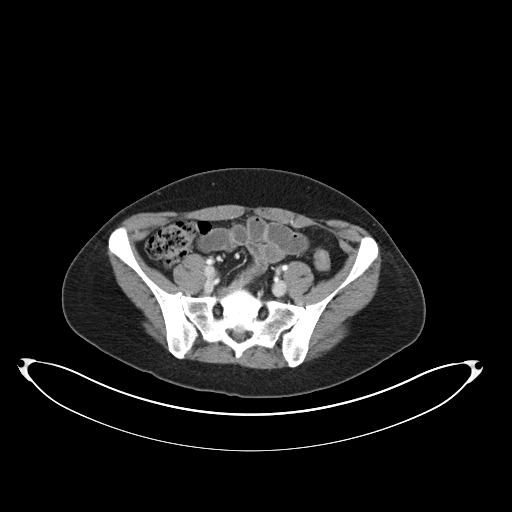
[im 41/91  soft-tissue]
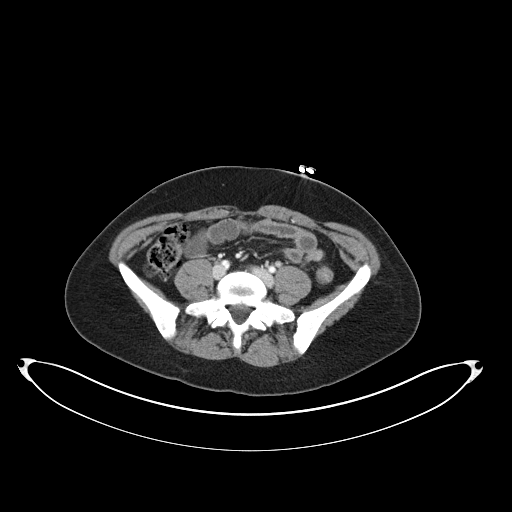
[im 51/91  soft-tissue]
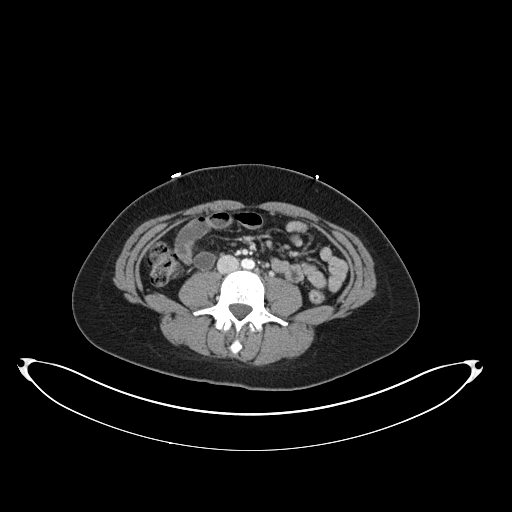
[im 56/91  soft-tissue]
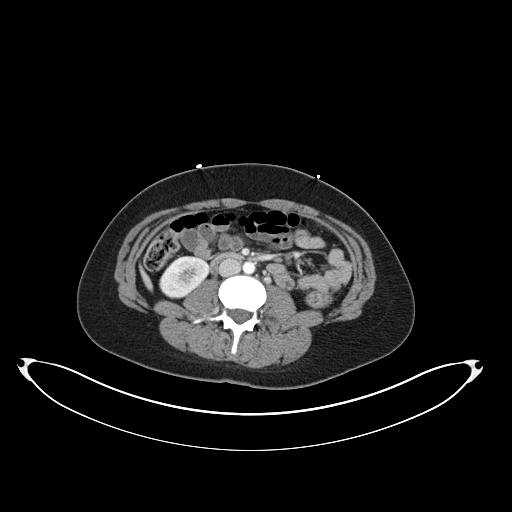
[im 56/91  bone]
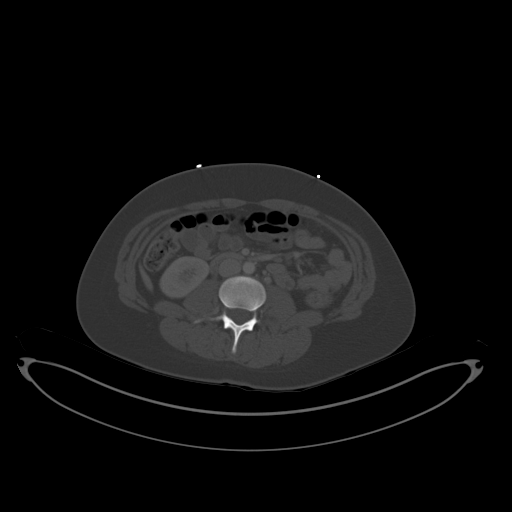
[im 61/91  soft-tissue]
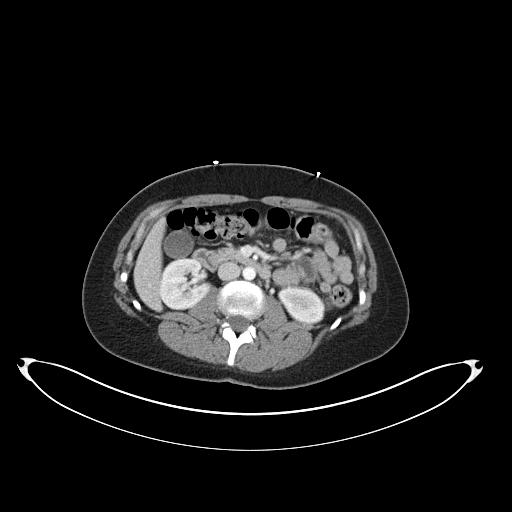
[im 66/91  soft-tissue]
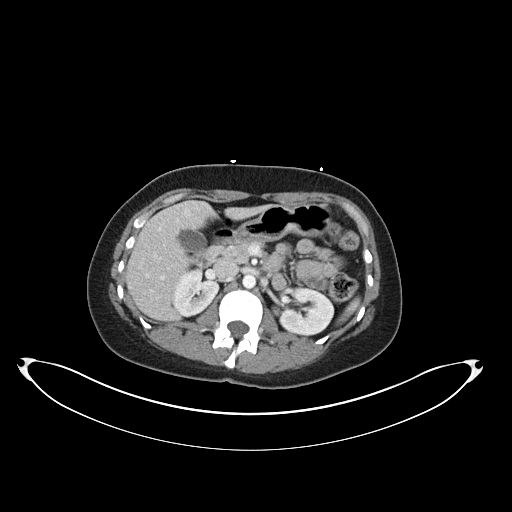
[im 71/91  soft-tissue]
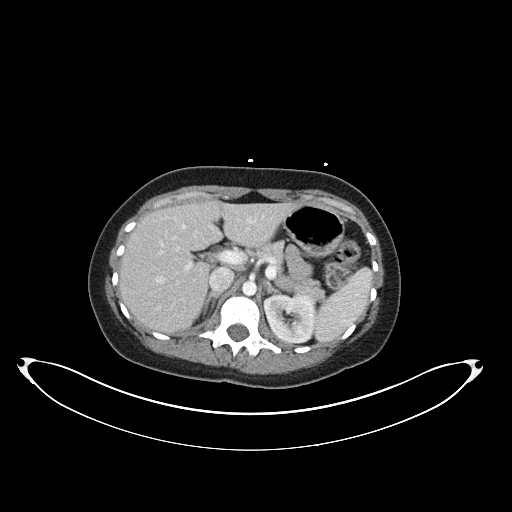
[im 81/91  soft-tissue]
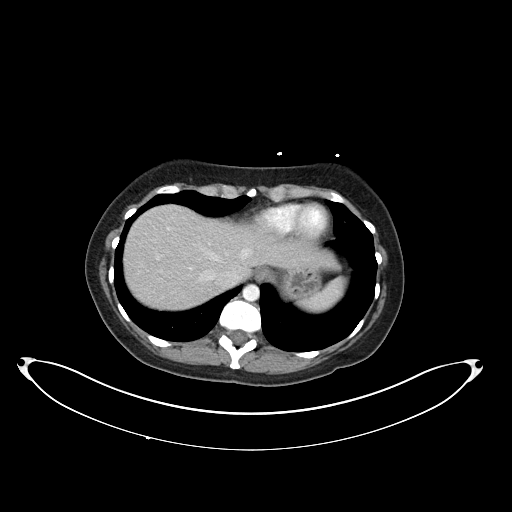
[im 86/91  soft-tissue]
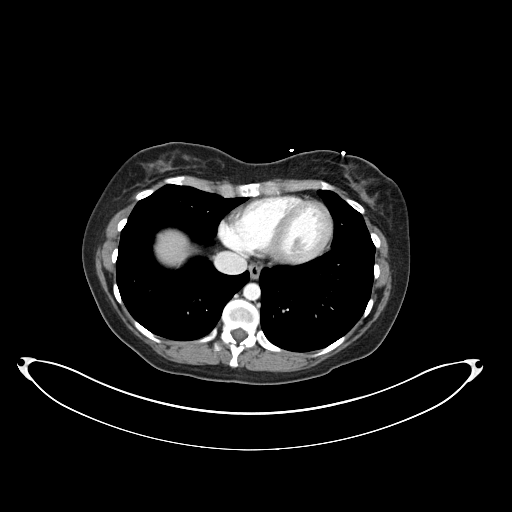

[Series 5: coronal st · coronal · 0.86mm/px · 3 of 124 slices shown]
[im 42/124  soft-tissue]
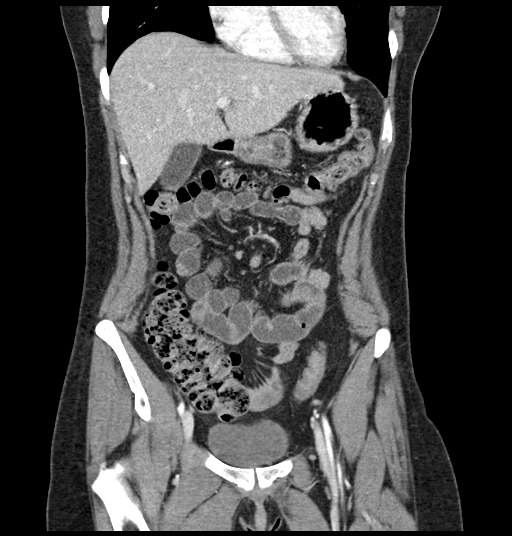
[im 55/124  soft-tissue]
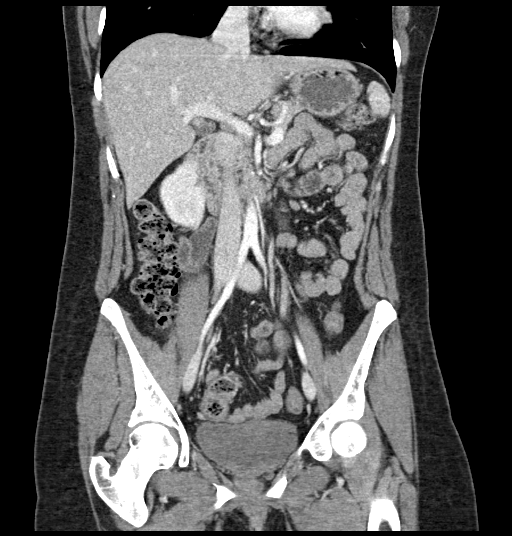
[im 69/124  soft-tissue]
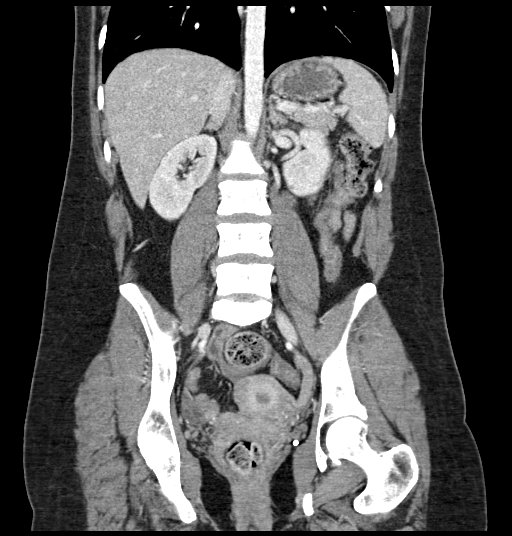

[17 of 46 positions shown; findings below may reference images not displayed]

RADIATION DOSE REDUCTION: This exam was performed according to the
departmental dose-optimization program which includes automated
exposure control, adjustment of the mA and/or kV according to
patient size and/or use of iterative reconstruction technique.

CONTRAST:  100mL OMNIPAQUE IOHEXOL 300 MG/ML  SOLN
FINDINGS: Lower chest: No acute abnormality.

Hepatobiliary: No focal liver abnormality is seen. No gallstones,
gallbladder wall thickening, or biliary dilatation.

Pancreas: Unremarkable. No pancreatic ductal dilatation or
surrounding inflammatory changes.

Spleen: Normal in size without focal abnormality.

Adrenals/Urinary Tract: Adrenal glands are unremarkable. Kidneys are
normal, without renal calculi, focal lesion, or hydronephrosis.
Bladder is unremarkable.

Stomach/Bowel: Stomach is within normal limits. Appendix appears
normal. A moderate amount retained stool is seen within the mid to
distal sigmoid colon and rectum. No evidence of bowel wall
thickening, distention, or inflammatory changes.

Vascular/Lymphatic: No significant vascular findings are present. No
enlarged abdominal or pelvic lymph nodes.

Reproductive: The uterus is unremarkable. Small bilateral ovarian
follicles are seen.

Other: No abdominal wall hernia or abnormality. No abdominopelvic
ascites.

Musculoskeletal: No acute or significant osseous findings.
IMPRESSION: No acute or active process within the abdomen or pelvis.

## 2024-01-24 ENCOUNTER — Emergency Department (HOSPITAL_BASED_OUTPATIENT_CLINIC_OR_DEPARTMENT_OTHER): Payer: MEDICAID

## 2024-01-24 ENCOUNTER — Other Ambulatory Visit: Payer: Self-pay

## 2024-01-24 ENCOUNTER — Emergency Department (HOSPITAL_BASED_OUTPATIENT_CLINIC_OR_DEPARTMENT_OTHER)
Admission: EM | Admit: 2024-01-24 | Discharge: 2024-01-24 | Disposition: A | Discharge status: Home / Self Care | Payer: MEDICAID | Attending: Emergency Medicine | Admitting: Emergency Medicine

## 2024-01-24 DIAGNOSIS — J069 Acute upper respiratory infection, unspecified: Secondary | ICD-10-CM | POA: Diagnosis not present

## 2024-01-24 DIAGNOSIS — Z20822 Contact with and (suspected) exposure to covid-19: Secondary | ICD-10-CM | POA: Insufficient documentation

## 2024-01-24 DIAGNOSIS — R509 Fever, unspecified: Secondary | ICD-10-CM | POA: Diagnosis present

## 2024-01-24 LAB — RESP PANEL BY RT-PCR (RSV, FLU A&B, COVID)  RVPGX2
Influenza A by PCR: NEGATIVE
Influenza B by PCR: NEGATIVE
Resp Syncytial Virus by PCR: NEGATIVE
SARS Coronavirus 2 by RT PCR: NEGATIVE

## 2024-01-24 NOTE — ED Provider Notes (Signed)
  EMERGENCY DEPARTMENT AT [facility] Provider Note   CSN: [id number] Arrival date & time: 01/24/24  1740     History  Chief Complaint  Patient presents with   Fever    Cheryl Green is a 33 y.o. female.  33 year old female presents today for concern of fever, chills, sweats.  She states last week she had a URI illness with productive cough.  That has mostly resolved.  She states the symptoms started yesterday.  Denies any shortness of breath, chest pain.  Although this was mentioned in triage note.  Has not taken any medications prior to arrival.  The history is provided by the patient. No language interpreter was used.       Home Medications Prior to Admission medications   Medication Sig Start Date End Date Taking? Authorizing Provider  acetaminophen  (TYLENOL ) 325 MG tablet Take 650 mg by mouth every 6 (six) hours as needed for mild pain, fever or headache.    [provider]  cefdinir  (OMNICEF ) 300 MG capsule Take 1 capsule (300 mg total) by mouth 2 (two) times daily. 04/16/22   Conklin, Erica R, PA-C  chlorhexidine  (PERIDEX ) 0.12 % solution Use as directed 15 mLs in the mouth or throat 2 (two) times daily. 11/07/21   Eudelia Maude SAUNDERS, PA-C  cyclobenzaprine  (FLEXERIL ) 10 MG tablet Take 1 tablet (10 mg total) by mouth 2 (two) times daily as needed for muscle spasms. 04/16/22   Conklin, Erica R, PA-C  doxycycline  (VIBRAMYCIN ) 100 MG capsule Take 1 capsule (100 mg total) by mouth 2 (two) times daily. 02/23/22   Henderly, Britni A, PA-C  escitalopram (LEXAPRO) 10 MG tablet Take 30 mg by mouth daily.    [provider]  hydrOXYzine  (ATARAX /VISTARIL ) 50 MG tablet Take 50 mg by mouth in the morning and at bedtime.    [provider]  lidocaine  (XYLOCAINE ) 2 % solution Use as directed 15 mLs in the mouth or throat every 4 (four) hours as needed for mouth pain. 11/07/21   Badalamente, Peter R, PA-C  loperamide (IMODIUM A-D) 2 MG tablet  Take 4 mg by mouth 4 (four) times daily as needed for diarrhea or loose stools.    [provider]  meclizine (ANTIVERT) 25 MG tablet Take 25 mg by mouth 3 (three) times daily as needed for dizziness.    [provider]  naproxen  (EC-NAPROXEN ) 500 MG EC tablet Take 1 tablet (500 mg total) by mouth 2 (two) times daily as needed (pain). 06/08/22   Petrucelli, Samantha R, PA-C  ondansetron  (ZOFRAN -ODT) 4 MG disintegrating tablet Take 1 tablet (4 mg total) by mouth every 8 (eight) hours as needed for nausea or vomiting. 06/08/22   Petrucelli, Samantha R, PA-C  Potassium Chloride  ER 20 MEQ TBCR Take 20 mEq by mouth daily for 7 days. 12/15/20 12/22/20  Schuyler Charlie RAMAN, MD      Allergies    Amoxicillin     Review of Systems   Review of Systems  Constitutional:  Positive for chills and fever.  Respiratory:  Negative for cough and shortness of breath.   Cardiovascular:  Negative for chest pain, palpitations and leg swelling.  Neurological:  Negative for light-headedness.  All other systems reviewed and are negative.   Physical Exam Updated Vital Signs BP 119/76   Pulse (!) 59   Temp 98.3 F (36.8 C)   Resp 20   LMP 01/23/2024   SpO2 99%  Physical Exam Vitals and nursing note reviewed.  Constitutional:  General: She is not in acute distress.    Appearance: Normal appearance. She is not ill-appearing.  HENT:     Head: Normocephalic and atraumatic.     Nose: Nose normal.  Eyes:     Conjunctiva/sclera: Conjunctivae normal.  Cardiovascular:     Rate and Rhythm: Normal rate and regular rhythm.  Pulmonary:     Effort: Pulmonary effort is normal. No respiratory distress.     Breath sounds: Normal breath sounds. No wheezing.  Musculoskeletal:        General: No deformity. Normal range of motion.     Cervical back: Normal range of motion.  Skin:    Findings: No rash.  Neurological:     Mental Status: She is alert.     ED Results / Procedures / Treatments    Labs (all labs ordered are listed, but only abnormal results are displayed) Labs Reviewed  RESP PANEL BY RT-PCR (RSV, FLU A&B, COVID)  [id number]    EKG None  Radiology DG Chest 2 View Result Date: 01/24/2024 CLINICAL DATA:  evaluate for pneumonia EXAM: CHEST - 2 VIEW COMPARISON:  None Available. FINDINGS: The heart and mediastinal contours are within normal limits. No focal consolidation. No pulmonary edema. No pleural effusion. No pneumothorax. No acute osseous abnormality. IMPRESSION: No active cardiopulmonary disease. Electronically Signed   By: Morgane  Naveau M.D.   On: 01/24/2024 20:18    Procedures Procedures    Medications Ordered in ED Medications - No data to display  ED Course/ Medical Decision Making/ A&P                                 Medical Decision Making Amount and/or Complexity of Data Reviewed Radiology: ordered.   33 year old female presents today for concern of fever, chills, sweats.  Recently had a URI illness last week.  Will obtain chest x-ray to ensure she does not have pneumonia causing her fever.  Respiratory panel negative.  EKG without acute ischemic change.  Chest x-ray without acute cardiopulmonary process.  I personally reviewed this and agree with radiology interpretation.  Patient is otherwise stable for discharge.  Discharged in stable condition.  Return precaution discussed.   Final Clinical Impression(s) / ED Diagnoses Final diagnoses:  Viral upper respiratory tract infection    Rx / DC Orders ED Discharge Orders     None         Hildegard Loge, PA-C 01/24/24 2035    Geraldene Hamilton, MD 01/24/24 2355

## 2024-01-24 NOTE — Discharge Instructions (Signed)
 Your workup today was reassuring.  Respiratory panel negative.  Chest x-ray did not show evidence of pneumonia.  EKG looked okay.  Follow-up with a primary care provider.  Information given.

## 2024-01-24 NOTE — ED Triage Notes (Signed)
 Pt reports having chills, fever, diarrhea& headache that started yesterday. Also reports chest pressure.

## 2024-03-13 ENCOUNTER — Ambulatory Visit (HOSPITAL_COMMUNITY): Payer: MEDICAID | Admitting: Mental Health

## 2024-03-13 ENCOUNTER — Encounter (HOSPITAL_COMMUNITY): Payer: Self-pay

## 2024-03-18 ENCOUNTER — Encounter (HOSPITAL_COMMUNITY): Payer: Self-pay

## 2024-03-18 ENCOUNTER — Ambulatory Visit (INDEPENDENT_AMBULATORY_CARE_PROVIDER_SITE_OTHER): Payer: MEDICAID | Admitting: Clinical

## 2024-03-18 DIAGNOSIS — F4310 Post-traumatic stress disorder, unspecified: Secondary | ICD-10-CM | POA: Diagnosis not present

## 2024-03-18 DIAGNOSIS — F1121 Opioid dependence, in remission: Secondary | ICD-10-CM

## 2024-03-18 DIAGNOSIS — F331 Major depressive disorder, recurrent, moderate: Secondary | ICD-10-CM

## 2024-03-18 DIAGNOSIS — F1220 Cannabis dependence, uncomplicated: Secondary | ICD-10-CM
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
## 2024-10-30 ENCOUNTER — Other Ambulatory Visit: Payer: Self-pay

## 2024-10-30 ENCOUNTER — Emergency Department
Admission: EM | Admit: 2024-10-30 | Discharge: 2024-10-30 | Disposition: A | Discharge status: Home / Self Care | Payer: MEDICAID | Attending: Emergency Medicine | Admitting: Emergency Medicine

## 2024-10-30 DIAGNOSIS — T2017XA Burn of first degree of neck, initial encounter: Secondary | ICD-10-CM | POA: Insufficient documentation

## 2024-10-30 DIAGNOSIS — T2007XA Burn of unspecified degree of neck, initial encounter: Secondary | ICD-10-CM | POA: Diagnosis present

## 2024-10-30 DIAGNOSIS — T2013XA Burn of first degree of chin, initial encounter: Secondary | ICD-10-CM | POA: Insufficient documentation

## 2024-10-30 DIAGNOSIS — R21 Rash and other nonspecific skin eruption: Secondary | ICD-10-CM | POA: Insufficient documentation

## 2024-10-30 DIAGNOSIS — Y990 Civilian activity done for income or pay: Secondary | ICD-10-CM | POA: Insufficient documentation

## 2024-10-30 DIAGNOSIS — X100XXA Contact with hot drinks, initial encounter: Secondary | ICD-10-CM | POA: Insufficient documentation

## 2024-10-30 MED ORDER — SILVER SULFADIAZINE 1 % EX CREA: TOPICAL_CREAM | CUTANEOUS | 1 refills | Status: AC

## 2024-10-30 MED ORDER — HYDROCODONE-ACETAMINOPHEN 5-325 MG PO TABS
1.0000 | ORAL_TABLET | ORAL | Status: AC
  Administered 2024-10-30: 1 via ORAL
  Filled 2024-10-30: qty 1

## 2024-10-30 MED ORDER — SILVER SULFADIAZINE 1 % EX CREA
TOPICAL_CREAM | Freq: Once | CUTANEOUS | Status: AC
Start: 2024-10-30 — End: 2024-10-30
  Administered 2024-10-30: 1 via TOPICAL
  Filled 2024-10-30: qty 20

## 2024-10-30 MED ORDER — HYDROCODONE-ACETAMINOPHEN 5-325 MG PO TABS: 1.0000 | ORAL_TABLET | Freq: Three times a day (TID) | ORAL | 0 refills | Status: AC | PRN

## 2024-10-30 MED ORDER — IBUPROFEN 600 MG PO TABS
600.0000 mg | ORAL_TABLET | Freq: Once | ORAL | Status: AC
  Administered 2024-10-30: 600 mg via ORAL
  Filled 2024-10-30: qty 1

## 2024-10-30 NOTE — ED Provider Notes (Signed)
 Duplicate. Please see previous note   Charlene Debby JAYSON DEVONNA 11/05/24 1536    Arlander Charleston, MD 11/05/24 [phone]
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
## 2024-10-30 NOTE — Discharge Instructions (Addendum)
 Please continue with silver sulfadiazine cream along the neck.  You may take Tylenol  and ibuprofen  as needed for pain.  Use Norco as needed for breakthrough pain.  If you develop any difficulty swallowing or difficulty breathing please return to the ER immediately.

## 2024-10-30 NOTE — ED Notes (Signed)
 This tech went into room to do workers comp on pt,but pt stated that she was not going to do workers comp she will just file it with her personal medicaid. This tech let pt's RN know.
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
